# Patient Record
Sex: Female | Born: 1950 | Race: White | Hispanic: No | Marital: Married
Health system: Southern US, Academic
[De-identification: ages and names within clinical notes are randomized; demographics above are authoritative.]

## PROBLEM LIST (undated history)

## (undated) ENCOUNTER — Telehealth

## (undated) ENCOUNTER — Ambulatory Visit: Payer: MEDICARE

## (undated) ENCOUNTER — Encounter: Attending: Hematology & Oncology | Primary: Hematology & Oncology

## (undated) ENCOUNTER — Ambulatory Visit

## (undated) ENCOUNTER — Encounter

## (undated) ENCOUNTER — Encounter: Attending: Surgical Oncology | Primary: Surgical Oncology

## (undated) ENCOUNTER — Ambulatory Visit: Attending: Surgical Oncology | Primary: Surgical Oncology

## (undated) ENCOUNTER — Telehealth: Attending: Pharmacist | Primary: Pharmacist

## (undated) ENCOUNTER — Encounter: Attending: Adult Health | Primary: Adult Health

## (undated) ENCOUNTER — Telehealth: Attending: Hematology & Oncology | Primary: Hematology & Oncology

## (undated) ENCOUNTER — Telehealth: Attending: Nurse Practitioner | Primary: Nurse Practitioner

## (undated) ENCOUNTER — Ambulatory Visit: Attending: Radiation Oncology | Primary: Radiation Oncology

## (undated) ENCOUNTER — Telehealth: Attending: Family Medicine | Primary: Family Medicine

## (undated) ENCOUNTER — Encounter: Attending: Nurse Practitioner | Primary: Nurse Practitioner

## (undated) ENCOUNTER — Encounter: Attending: Pharmacist | Primary: Pharmacist

## (undated) ENCOUNTER — Telehealth: Attending: Internal Medicine | Primary: Internal Medicine

## (undated) ENCOUNTER — Ambulatory Visit: Payer: MEDICARE | Attending: Dermatology | Primary: Dermatology

## (undated) ENCOUNTER — Inpatient Hospital Stay

## (undated) ENCOUNTER — Ambulatory Visit: Payer: MEDICARE | Attending: Adult Health | Primary: Adult Health

## (undated) ENCOUNTER — Encounter: Attending: Internal Medicine | Primary: Internal Medicine

## (undated) ENCOUNTER — Ambulatory Visit: Payer: MEDICARE | Attending: Pharmacist | Primary: Pharmacist

## (undated) ENCOUNTER — Ambulatory Visit: Payer: MEDICARE | Attending: Radiation Oncology | Primary: Radiation Oncology

## (undated) ENCOUNTER — Institutional Professional Consult (permissible substitution): Payer: MEDICARE

## (undated) ENCOUNTER — Encounter: Attending: Registered" | Primary: Registered"

## (undated) ENCOUNTER — Telehealth: Attending: Adult Health | Primary: Adult Health

## (undated) ENCOUNTER — Ambulatory Visit: Payer: MEDICARE | Attending: Hematology & Oncology | Primary: Hematology & Oncology

## (undated) ENCOUNTER — Telehealth: Attending: Clinical | Primary: Clinical

## (undated) ENCOUNTER — Ambulatory Visit
Attending: Student in an Organized Health Care Education/Training Program | Primary: Student in an Organized Health Care Education/Training Program

## (undated) DIAGNOSIS — F319 Bipolar disorder, unspecified: Secondary | ICD-10-CM

## (undated) DIAGNOSIS — F17200 Nicotine dependence, unspecified, uncomplicated: Secondary | ICD-10-CM

## (undated) DIAGNOSIS — M1990 Unspecified osteoarthritis, unspecified site: Secondary | ICD-10-CM

## (undated) DIAGNOSIS — C50919 Malignant neoplasm of unspecified site of unspecified female breast: Secondary | ICD-10-CM

## (undated) HISTORY — PX: BREAST BIOPSY: SHX20

## (undated) HISTORY — PX: BREAST LUMPECTOMY: SHX2

## (undated) HISTORY — DX: Nicotine dependence, unspecified, uncomplicated: F17.200

## (undated) HISTORY — DX: Bipolar disorder, unspecified: F31.9

## (undated) HISTORY — DX: Unspecified osteoarthritis, unspecified site: M19.90

## (undated) HISTORY — DX: Malignant neoplasm of unspecified site of unspecified female breast: C50.919

---

## 2008-03-11 ENCOUNTER — Ambulatory Visit (HOSPITAL_COMMUNITY): Admission: RE | Admit: 2008-03-11 | Discharge: 2008-03-11 | Payer: Self-pay | Admitting: Internal Medicine

## 2011-08-27 ENCOUNTER — Other Ambulatory Visit (HOSPITAL_COMMUNITY): Payer: Self-pay | Admitting: Internal Medicine

## 2011-08-27 DIAGNOSIS — Z139 Encounter for screening, unspecified: Secondary | ICD-10-CM

## 2011-09-03 ENCOUNTER — Ambulatory Visit (HOSPITAL_COMMUNITY)
Admission: RE | Admit: 2011-09-03 | Discharge: 2011-09-03 | Disposition: A | Discharge status: Home / Self Care | Payer: BC Managed Care – PPO | Source: Ambulatory Visit | Attending: Internal Medicine | Admitting: Internal Medicine

## 2011-09-03 DIAGNOSIS — Z139 Encounter for screening, unspecified: Secondary | ICD-10-CM

## 2011-11-04 ENCOUNTER — Ambulatory Visit (HOSPITAL_COMMUNITY): Payer: BC Managed Care – PPO

## 2011-11-15 ENCOUNTER — Other Ambulatory Visit (HOSPITAL_COMMUNITY): Payer: Self-pay | Admitting: Internal Medicine

## 2011-11-15 DIAGNOSIS — R109 Unspecified abdominal pain: Secondary | ICD-10-CM

## 2011-11-21 ENCOUNTER — Ambulatory Visit (HOSPITAL_COMMUNITY)
Admission: RE | Admit: 2011-11-21 | Discharge: 2011-11-21 | Disposition: A | Discharge status: Home / Self Care | Payer: BC Managed Care – PPO | Source: Ambulatory Visit | Attending: Internal Medicine | Admitting: Internal Medicine

## 2011-11-21 DIAGNOSIS — K5730 Diverticulosis of large intestine without perforation or abscess without bleeding: Secondary | ICD-10-CM | POA: Insufficient documentation

## 2011-11-21 DIAGNOSIS — K7689 Other specified diseases of liver: Secondary | ICD-10-CM | POA: Insufficient documentation

## 2011-11-21 DIAGNOSIS — R109 Unspecified abdominal pain: Secondary | ICD-10-CM

## 2011-11-21 DIAGNOSIS — R1032 Left lower quadrant pain: Secondary | ICD-10-CM | POA: Insufficient documentation

## 2011-11-21 MED ORDER — IOHEXOL 300 MG/ML  SOLN
100.0000 mL | Freq: Once | INTRAMUSCULAR | Status: AC | PRN
  Administered 2011-11-21: 100 mL via INTRAVENOUS

## 2011-12-12 ENCOUNTER — Encounter (INDEPENDENT_AMBULATORY_CARE_PROVIDER_SITE_OTHER): Payer: Self-pay | Admitting: *Deleted

## 2011-12-19 ENCOUNTER — Ambulatory Visit (INDEPENDENT_AMBULATORY_CARE_PROVIDER_SITE_OTHER): Payer: BC Managed Care – PPO | Admitting: Internal Medicine

## 2011-12-24 ENCOUNTER — Encounter (INDEPENDENT_AMBULATORY_CARE_PROVIDER_SITE_OTHER): Payer: Self-pay | Admitting: Internal Medicine

## 2011-12-24 ENCOUNTER — Ambulatory Visit (INDEPENDENT_AMBULATORY_CARE_PROVIDER_SITE_OTHER): Payer: BC Managed Care – PPO | Admitting: Internal Medicine

## 2011-12-24 VITALS — BP 110/56 | HR 76 | Temp 98.0°F | Ht 70.0 in | Wt 153.0 lb

## 2011-12-24 DIAGNOSIS — R109 Unspecified abdominal pain: Secondary | ICD-10-CM

## 2011-12-24 LAB — CBC WITH DIFFERENTIAL/PLATELET
Eosinophils Absolute: 0.2 10*3/uL (ref 0.0–0.7)
Eosinophils Relative: 2 % (ref 0–5)
Hemoglobin: 14.1 g/dL (ref 12.0–15.0)
Lymphocytes Relative: 19 % (ref 12–46)
Lymphs Abs: 1.5 10*3/uL (ref 0.7–4.0)
MCH: 33.3 pg (ref 26.0–34.0)
MCV: 92.9 fL (ref 78.0–100.0)
Monocytes Relative: 6 % (ref 3–12)
Neutrophils Relative %: 73 % (ref 43–77)
RBC: 4.23 MIL/uL (ref 3.87–5.11)
WBC: 7.7 10*3/uL (ref 4.0–10.5)

## 2011-12-24 NOTE — Progress Notes (Signed)
Subjective:     Patient ID: Amy Ponce, female   DOB: Jun 09, 1951, 61 y.o.   MRN: [id number]  HPI Amy Ponce is a 61 yr old female referred to our office for abdominal pain.  IN May she had left flank pain and underwent a CT.  The pain comes and goes.  She tells me her acid reflux is controlled with the Prilosec. Appetite is good. Weight loss from taking care of her husband but she ha gained this back.  No abdominal pain.  She will have cramps sometimes if she has a BM. No melena or bright red rectal bleeding. BM x 1 a day   Her last colonoscopy was 5-6 yrs ago and was normal.                     11/2011: CT abdomen/pelvis with CM:    Clinical Data: Left flank pain, history of kidney infection   IMPRESSION:  1. No acute inflammatory process within abdomen or pelvis.  2. Again noted mild hepatic fatty infiltration.  3. No hydronephrosis or hydroureter. Bilateral renal symmetrical  excretion.  4. No pericecal inflammation.  5. Retroflexed uterus.   Review of Systems see hpi Current Outpatient Prescriptions  Medication Sig Dispense Refill  . ALPRAZolam (XANAX) 0.5 MG tablet Take 0.5 mg by mouth at bedtime as needed.      Marland Kitchen aspirin 81 MG tablet Take 81 mg by mouth daily.      Marland Kitchen lamoTRIgine (LAMICTAL) 100 MG tablet Take 100 mg by mouth daily.      Marland Kitchen omeprazole (PRILOSEC) 20 MG capsule Take 20 mg by mouth daily.      Marland Kitchen oxyCODONE-acetaminophen (PERCOCET) 5-325 MG per tablet Take 1 tablet by mouth every 4 (four) hours as needed.       Current Outpatient Prescriptions on File Prior to Visit  Medication Sig Dispense Refill  . lamoTRIgine (LAMICTAL) 100 MG tablet Take 100 mg by mouth daily.      Marland Kitchen omeprazole (PRILOSEC) 20 MG capsule Take 20 mg by mouth daily.       Past Medical History  Diagnosis Date  . Bipolar 1 disorder    Past Surgical History  Procedure Date  . Breast biopsy    History   Social History  . Marital Status: Married    Spouse Name: N/A    Number of Children: N/A   . Years of Education: N/A   Occupational History  . Not on file.   Social History Main Topics  . Smoking status: Current Everyday Smoker  . Smokeless tobacco: Not on file   Comment: 1 pack day  . Alcohol Use: No  . Drug Use: No  . Sexually Active: Not on file   Other Topics Concern  . Not on file   Social History Narrative  . No narrative on file   Family Status  Relation Status Death Age  . Mother Deceased     CAD  . Father Deceased     Diabetic complications  . Sister Deceased     One deceased CAD, Two in good health  . Brother Deceased     Two deceased from cancer, one died from ?    No Known Allergies      Objective:   Physical Exam Filed Vitals:   12/24/11 1146  Height: 5\' 10"  (1.778 m)  Weight: 153 lb (69.4 kg)  Alert and oriented. Skin warm and dry. Oral mucosa is moist.   . Sclera anicteric,  conjunctivae is pink. Thyroid not enlarged. No cervical lymphadenopathy. Lungs clear. Heart regular rate and rhythm.  Abdomen is soft. Bowel sounds are positive. No hepatomegaly. No abdominal masses felt. No tenderness.  No edema to lower extremities. Patient is alert and oriented.       Assessment:   Left flank pain which has resolved. I do not think this is a GI problem. If pain returns , however, she is to call our office.    Plan:    Please call office if pain returns.

## 2011-12-24 NOTE — Patient Instructions (Addendum)
Continue present medications. If pain returns, she will need to call our office. cmet and cbc today

## 2011-12-25 LAB — COMPREHENSIVE METABOLIC PANEL
ALT: 34 U/L (ref 0–35)
CO2: 29 mEq/L (ref 19–32)
Calcium: 9.6 mg/dL (ref 8.4–10.5)
Chloride: 103 mEq/L (ref 96–112)
Glucose, Bld: 106 mg/dL — ABNORMAL HIGH (ref 70–99)
Sodium: 139 mEq/L (ref 135–145)
Total Bilirubin: 0.4 mg/dL (ref 0.3–1.2)
Total Protein: 6.9 g/dL (ref 6.0–8.3)

## 2012-10-26 ENCOUNTER — Emergency Department (HOSPITAL_COMMUNITY)
Admission: EM | Admit: 2012-10-26 | Discharge: 2012-10-26 | Disposition: A | Discharge status: Home / Self Care | Payer: BC Managed Care – PPO | Attending: Emergency Medicine | Admitting: Emergency Medicine

## 2012-10-26 ENCOUNTER — Emergency Department (HOSPITAL_COMMUNITY): Payer: BC Managed Care – PPO

## 2012-10-26 ENCOUNTER — Encounter (HOSPITAL_COMMUNITY): Payer: Self-pay | Admitting: *Deleted

## 2012-10-26 ENCOUNTER — Encounter: Payer: Self-pay | Admitting: Emergency Medicine

## 2012-10-26 DIAGNOSIS — R079 Chest pain, unspecified: Secondary | ICD-10-CM

## 2012-10-26 DIAGNOSIS — F17200 Nicotine dependence, unspecified, uncomplicated: Secondary | ICD-10-CM | POA: Insufficient documentation

## 2012-10-26 DIAGNOSIS — Z79899 Other long term (current) drug therapy: Secondary | ICD-10-CM | POA: Insufficient documentation

## 2012-10-26 DIAGNOSIS — F319 Bipolar disorder, unspecified: Secondary | ICD-10-CM | POA: Insufficient documentation

## 2012-10-26 LAB — BASIC METABOLIC PANEL
CO2: 28 mEq/L (ref 19–32)
Calcium: 9.8 mg/dL (ref 8.4–10.5)
Chloride: 99 mEq/L (ref 96–112)
Glucose, Bld: 96 mg/dL (ref 70–99)
Potassium: 4.2 mEq/L (ref 3.5–5.1)
Sodium: 136 mEq/L (ref 135–145)

## 2012-10-26 LAB — CBC WITH DIFFERENTIAL/PLATELET
Basophils Absolute: 0 10*3/uL (ref 0.0–0.1)
Lymphocytes Relative: 28 % (ref 12–46)
Lymphs Abs: 2.2 10*3/uL (ref 0.7–4.0)
MCV: 96.5 fL (ref 78.0–100.0)
Neutro Abs: 5.1 10*3/uL (ref 1.7–7.7)
Neutrophils Relative %: 65 % (ref 43–77)
Platelets: 246 10*3/uL (ref 150–400)
RBC: 4.23 MIL/uL (ref 3.87–5.11)
RDW: 13.4 % (ref 11.5–15.5)
WBC: 7.9 10*3/uL (ref 4.0–10.5)

## 2012-10-26 LAB — TROPONIN I: Troponin I: <0.3 ng/mL (ref <0.30)

## 2012-10-26 MED ORDER — VERAPAMIL HCL 80 MG PO TABS: 80.0000 mg | ORAL_TABLET | Freq: Three times a day (TID) | ORAL | Status: DC

## 2012-10-26 MED ORDER — NITROGLYCERIN 0.4 MG SL SUBL: 0.4000 mg | SUBLINGUAL_TABLET | SUBLINGUAL | Status: AC | PRN

## 2012-10-26 NOTE — ED Notes (Signed)
Patient given water per RN approval. 

## 2012-10-26 NOTE — ED Provider Notes (Signed)
History  This chart was scribed for Nelia Shi, MD by Shari Heritage, ED Scribe. The patient was seen in room APA17/APA17. Patient's care was started at 1136.   CSN: [id number]  Arrival date & time 10/26/12  1127   First MD Initiated Contact with Patient 10/26/12 1136      Chief Complaint  Patient presents with  . Chest Pain     The history is provided by the patient. No language interpreter was used.     HPI Comments: Amy Ponce is a 62 y.o. female who presents to the Emergency Department complaining of intermittent, central chest pain onset 1 week ago. She states that pain worsened over the weekend. Pain is worse with exertion and is significantly improved with rest. There is associated shortness of breath, lightheadedness and nausea. She also states that she had an episode of diaphoresis yesterday. Patient says that she has a productive cough, but it is not different from baseline. Patient was sent to the ED for evaluation from Dr. Sharyon Medicus office where she was give 3 Bayer Aspirin. Patient had a stress test in 2012 which was negative. She states that she has a family history of MI (mother who died at 58).  Patient is a current every day smoker. She does not use alcohol. Her medical history includes bipolar 1 disorder.    Past Medical History  Diagnosis Date  . Bipolar 1 disorder     Past Surgical History  Procedure Laterality Date  . Breast biopsy      No family history on file.  History  Substance Use Topics  . Smoking status: Current Every Day Smoker    Types: Cigarettes  . Smokeless tobacco: Not on file     Comment: 1 pack day  . Alcohol Use: No    OB History   Grav Para Term Preterm Abortions TAB SAB Ect Mult Living                  Review of Systems A complete 10 system review of systems was obtained and all systems are negative except as noted in the HPI and PMH.   Allergies  Review of patient's allergies indicates no known allergies.  Home  Medications   Current Outpatient Rx  Name  Route  Sig  Dispense  Refill  . ALPRAZolam (XANAX) 0.5 MG tablet   Oral   Take 0.5 mg by mouth 4 (four) times daily as needed for anxiety.          Marland Kitchen aspirin 325 MG tablet   Oral   Take 975 mg by mouth daily as needed for pain.         . Cholecalciferol (VITAMIN D-3) 1000 UNITS CAPS   Oral   Take 1,000 Units by mouth daily.         Marland Kitchen lamoTRIgine (LAMICTAL) 100 MG tablet   Oral   Take 100 mg by mouth daily.         Marland Kitchen omeprazole (PRILOSEC) 20 MG capsule   Oral   Take 20 mg by mouth daily.         Marland Kitchen OVER THE COUNTER MEDICATION   Oral   Take 1 tablet by mouth daily. I-Cool for menopause.         Marland Kitchen oxyCODONE-acetaminophen (PERCOCET) 5-325 MG per tablet   Oral   Take 1 tablet by mouth every 4 (four) hours as needed for pain.          . nitroGLYCERIN (NITROSTAT)  0.4 MG SL tablet   Sublingual   Place 1 tablet (0.4 mg total) under the tongue every 5 (five) minutes as needed for chest pain.   30 tablet   0   . verapamil (CALAN) 80 MG tablet   Oral   Take 1 tablet (80 mg total) by mouth 3 (three) times daily.   30 tablet   0     Triage Vitals: BP 146/80  Temp(Src) 97.7 F (36.5 C) (Oral)  Resp 20  Ht 5\' 11"  (1.803 m)  Wt 156 lb (70.761 kg)  BMI 21.77 kg/m2  SpO2 98%  Physical Exam  Nursing note and vitals reviewed. Constitutional: She is oriented to person, place, and time. She appears well-developed and well-nourished. No distress.  HENT:  Head: Normocephalic and atraumatic.  Eyes: Pupils are equal, round, and reactive to light.  Neck: Normal range of motion.  Cardiovascular: Intact distal pulses.  Bradycardia present.   Pulmonary/Chest: No respiratory distress.  Abdominal: Normal appearance. She exhibits no distension.  Musculoskeletal: Normal range of motion.  Neurological: She is alert and oriented to person, place, and time. No cranial nerve deficit.  Skin: Skin is warm and dry. No rash noted.   Psychiatric: She has a normal mood and affect. Her behavior is normal.    ED Course  Procedures (including critical care time) DIAGNOSTIC STUDIES: Oxygen Saturation is 98% on room air, normal by my interpretation.    COORDINATION OF CARE: 11:58 AM- Patient informed of current plan for treatment and evaluation and agrees with plan at this time.    Date: 10/26/2012  Rate: 54  Rhythm: This bradycardia  QRS Axis: normal  Intervals: normal  ST/T Wave abnormalities: normal  Conduction Disutrbances: none  Narrative Interpretation: Otherwise.normal      Labs Reviewed  CBC WITH DIFFERENTIAL  BASIC METABOLIC PANEL  TROPONIN I    Dg Chest Portable 1 View  10/26/2012  *RADIOLOGY REPORT*  Clinical Data: Chest pain for couple weeks worsened for past 2 days, smoker  PORTABLE CHEST - 1 VIEW  Comparison: Portable exam 1144 hours without priors for comparison  Findings: Normal heart size, mediastinal contours, and pulmonary vascularity. Lungs hyperexpanded but clear. Minimal peribronchial thickening. No pleural effusion or pneumothorax. No acute osseous findings.  IMPRESSION: Hyperinflated lungs with minimal peribronchial thickening question COPD or asthma. No acute infiltrate.   Original Report Authenticated By: Ulyses Southward, M.D.      1. Chest pain on exertion       MDM  I personally performed the services described in this documentation, which was scribed in my presence. The recorded information has been reviewed and considered.  I discussed case with cardiology([facility]) who will arrange for outpatient followup this week  Nelia Shi, MD 10/27/12 [phone]

## 2012-10-26 NOTE — ED Notes (Signed)
Patient is resting comfortably. 

## 2012-10-26 NOTE — ED Notes (Signed)
Cp x 1 wk with sob, lightheadedness, and nausea.  Sent by Dr. Sherwood Gambler - states Dr. Sherwood Gambler gave 3 Bayer Aspirin while at his office.

## 2012-10-26 NOTE — ED Notes (Signed)
Pt states she chewed 3 325mg  coated ASA at office instructed by her doctor Fusco, CP x 1 week but worse over the weekend

## 2012-11-04 ENCOUNTER — Other Ambulatory Visit (HOSPITAL_COMMUNITY): Payer: Self-pay | Admitting: Cardiovascular Disease

## 2012-11-04 DIAGNOSIS — R079 Chest pain, unspecified: Secondary | ICD-10-CM

## 2012-11-04 DIAGNOSIS — R0602 Shortness of breath: Secondary | ICD-10-CM

## 2012-11-05 HISTORY — PX: TRANSTHORACIC ECHOCARDIOGRAM: SHX275

## 2012-11-05 HISTORY — PX: NM MYOCAR PERF WALL MOTION: HXRAD629

## 2012-11-09 ENCOUNTER — Ambulatory Visit (HOSPITAL_COMMUNITY)
Admission: RE | Admit: 2012-11-09 | Discharge: 2012-11-09 | Disposition: A | Discharge status: Home / Self Care | Payer: BC Managed Care – PPO | Source: Ambulatory Visit | Attending: Cardiovascular Disease | Admitting: Cardiovascular Disease

## 2012-11-09 DIAGNOSIS — R0602 Shortness of breath: Secondary | ICD-10-CM | POA: Insufficient documentation

## 2012-11-09 DIAGNOSIS — R079 Chest pain, unspecified: Secondary | ICD-10-CM

## 2012-11-09 DIAGNOSIS — R002 Palpitations: Secondary | ICD-10-CM | POA: Insufficient documentation

## 2012-11-09 DIAGNOSIS — R0609 Other forms of dyspnea: Secondary | ICD-10-CM | POA: Insufficient documentation

## 2012-11-09 DIAGNOSIS — R42 Dizziness and giddiness: Secondary | ICD-10-CM | POA: Insufficient documentation

## 2012-11-09 DIAGNOSIS — Z8249 Family history of ischemic heart disease and other diseases of the circulatory system: Secondary | ICD-10-CM | POA: Insufficient documentation

## 2012-11-09 DIAGNOSIS — F17200 Nicotine dependence, unspecified, uncomplicated: Secondary | ICD-10-CM | POA: Insufficient documentation

## 2012-11-09 DIAGNOSIS — R0989 Other specified symptoms and signs involving the circulatory and respiratory systems: Secondary | ICD-10-CM | POA: Insufficient documentation

## 2012-11-09 MED ORDER — REGADENOSON 0.4 MG/5ML IV SOLN
0.4000 mg | Freq: Once | INTRAVENOUS | Status: AC
  Administered 2012-11-09: 0.4 mg via INTRAVENOUS

## 2012-11-09 MED ORDER — TECHNETIUM TC 99M SESTAMIBI GENERIC - CARDIOLITE
10.9000 | Freq: Once | INTRAVENOUS | Status: AC | PRN
  Administered 2012-11-09: 10.9 via INTRAVENOUS

## 2012-11-09 MED ORDER — TECHNETIUM TC 99M SESTAMIBI GENERIC - CARDIOLITE
32.0000 | Freq: Once | INTRAVENOUS | Status: AC | PRN
  Administered 2012-11-09: 32 via INTRAVENOUS

## 2012-11-09 MED ORDER — AMINOPHYLLINE 25 MG/ML IV SOLN
75.0000 mg | Freq: Once | INTRAVENOUS | Status: AC
  Administered 2012-11-09: 75 mg via INTRAVENOUS

## 2012-11-09 NOTE — Progress Notes (Signed)
2D Echo Performed 11/09/2012    Cleda Imel, RCS  

## 2012-11-09 NOTE — Procedures (Addendum)
[facility] [address] [place] [address] [place] [address] [phone]  Cardiology Nuclear Med Study  Amy Ponce is a 62 y.o. female     MRN : [id number]     DOB: 02-26-1951  Procedure Date: 11/09/2012  Nuclear Med Background Indication for Stress Test:  Post Hospital History:  no prior cardiac or respiratory history reported by pt. Cardiac Risk Factors: Family History - CAD and Smoker  Symptoms:  Chest Pain, Dizziness, DOE, Fatigue, Light-Headedness, Palpitations and SOB   Nuclear Pre-Procedure Caffeine/Decaff Intake:  10:00pm NPO After: 8:00am   IV Site: R Antecubital  IV 0.9% NS with Angio Cath:  22g  Chest Size (in):  N/A IV Started by: Emmit Pomfret, RN  Height: 5\' 11"  (1.803 m)  Cup Size: B  BMI:  Body mass index is 22.19 kg/(m^2). Weight:  159 lb (72.122 kg)   Tech Comments:  B/A    Nuclear Med Study 1 or 2 day study: 1 day  Stress Test Type:  Lexiscan  Order Authorizing Provider:  Susa Griffins, MD   Resting Radionuclide: Technetium 72m Sestamibi  Resting Radionuclide Dose: 10.9 mCi   Stress Radionuclide:  Technetium 52m Sestamibi  Stress Radionuclide Dose: 32.0 mCi           Stress Protocol Rest HR: 55 Stress HR: 74  Rest BP: 129/88 Stress BP: 135/76  Exercise Time (min): n/a METS: n/a          Dose of Adenosine (mg):  n/a Dose of Lexiscan: 0.4 mg  Dose of Atropine (mg): n/a Dose of Dobutamine: n/a mcg/kg/min (at max HR)  Stress Test Technologist: Ernestene Mention, CCT Nuclear Technologist: Gonzella Lex, CNMT   Rest Procedure:  Myocardial perfusion imaging was performed at rest 45 minutes following the intravenous administration of Technetium 73m Sestamibi. Stress Procedure:  The patient received IV Lexiscan 0.4 mg over 15-seconds.  Technetium 59m Sestamibi injected at 30-seconds.  There were no significant changes with Lexiscan.  Quantitative spect images were obtained after a 45 minute  delay.  Transient Ischemic Dilatation (Normal <1.22):  1.12 Lung/Heart Ratio (Normal <0.45):  0.40 QGS EDV:  82 ml QGS ESV:  31 ml LV Ejection Fraction: 62%  Rest ECG: NSR - Normal EKG  Stress ECG: No significant change from baseline ECG  QPS Raw Data Images:  Normal; no motion artifact; normal heart/lung ratio. Stress Images:  Normal homogeneous uptake in all areas of the myocardium. Rest Images:  Normal homogeneous uptake in all areas of the myocardium. Subtraction (SDS):  No evidence of ischemia.  Impression Exercise Capacity:  Lexiscan with no exercise. BP Response:  Normal blood pressure response. Clinical Symptoms:  No significant symptoms noted. ECG Impression:  No significant ECG changes with Lexiscan. Comparison with Prior Nuclear Study: No previous nuclear study performed  Overall Impression:  Normal stress nuclear study.  LV Wall Motion:  NL LV Function; NL Wall Motion; EF 62%  Chrystie Nose, MD, [facility] Board Certified in Nuclear Cardiology Attending Cardiologist [facility]  Chrystie Nose, MD  11/09/2012 2:08 PM

## 2012-11-14 ENCOUNTER — Encounter: Payer: Self-pay | Admitting: Internal Medicine

## 2012-11-23 ENCOUNTER — Other Ambulatory Visit: Payer: Self-pay | Admitting: Cardiovascular Disease

## 2012-11-23 LAB — LIPID PANEL
HDL: 61 mg/dL (ref >39)
LDL Cholesterol: 111 mg/dL — ABNORMAL HIGH (ref 0–99)

## 2013-01-01 ENCOUNTER — Telehealth: Payer: Self-pay | Admitting: Cardiovascular Disease

## 2013-01-01 NOTE — Telephone Encounter (Signed)
Called patient and let her know that her metoprolol succ 25 mg was ok for 90 days 3 refills to [facility] pharmacy

## 2013-01-01 NOTE — Telephone Encounter (Signed)
Patient states that a refill for her Metoprolol Succ 25mg  was called in yesterday.  She needs a 90 day supply instead of 30 day so her insurance will cover it.  [facility] [place], [place]  [phone]

## 2013-02-05 ENCOUNTER — Telehealth: Payer: Self-pay | Admitting: Cardiovascular Disease

## 2013-02-05 MED ORDER — SIMVASTATIN 20 MG PO TABS: 20.0000 mg | ORAL_TABLET | Freq: Every day | ORAL | Status: DC

## 2013-02-05 NOTE — Telephone Encounter (Signed)
Patient needs refill for Simvastatin 20 mg # 90 called to [facility] in [place], [place]  [phone]

## 2013-02-05 NOTE — Telephone Encounter (Signed)
Returned call.  Pt stated her insurance won't pay for #30 and needs #90.  Informed Rx will be sent.  Pt verbalized understanding and agreed w/ plan.  Refill(s) sent to pharmacy.

## 2013-03-31 ENCOUNTER — Encounter: Payer: Self-pay | Admitting: Cardiovascular Disease

## 2013-07-26 ENCOUNTER — Encounter: Payer: Self-pay | Admitting: *Deleted

## 2013-07-30 ENCOUNTER — Ambulatory Visit: Payer: BC Managed Care – PPO | Admitting: Internal Medicine

## 2013-08-13 ENCOUNTER — Ambulatory Visit (INDEPENDENT_AMBULATORY_CARE_PROVIDER_SITE_OTHER): Payer: BC Managed Care – PPO | Admitting: Internal Medicine

## 2013-08-13 VITALS — BP 100/60 | HR 56 | Ht 71.0 in | Wt 161.0 lb

## 2013-08-13 DIAGNOSIS — K219 Gastro-esophageal reflux disease without esophagitis: Secondary | ICD-10-CM

## 2013-08-13 DIAGNOSIS — Z8249 Family history of ischemic heart disease and other diseases of the circulatory system: Secondary | ICD-10-CM

## 2013-08-13 DIAGNOSIS — F17200 Nicotine dependence, unspecified, uncomplicated: Secondary | ICD-10-CM

## 2013-08-13 DIAGNOSIS — Z720 Tobacco use: Secondary | ICD-10-CM

## 2013-08-13 DIAGNOSIS — R0789 Other chest pain: Secondary | ICD-10-CM

## 2013-08-13 DIAGNOSIS — I10 Essential (primary) hypertension: Secondary | ICD-10-CM

## 2013-08-13 NOTE — Patient Instructions (Signed)
Your physician recommends that you schedule a follow-up appointment in: One year.  

## 2013-08-16 ENCOUNTER — Encounter: Payer: Self-pay | Admitting: Internal Medicine

## 2013-08-16 DIAGNOSIS — I10 Essential (primary) hypertension: Secondary | ICD-10-CM | POA: Insufficient documentation

## 2013-08-16 DIAGNOSIS — Z8249 Family history of ischemic heart disease and other diseases of the circulatory system: Secondary | ICD-10-CM | POA: Insufficient documentation

## 2013-08-16 DIAGNOSIS — Z720 Tobacco use: Secondary | ICD-10-CM | POA: Insufficient documentation

## 2013-08-16 DIAGNOSIS — K219 Gastro-esophageal reflux disease without esophagitis: Secondary | ICD-10-CM | POA: Insufficient documentation

## 2013-08-16 NOTE — Progress Notes (Signed)
OFFICE NOTE  Chief Complaint:  Establish new cardiologist  Primary Care Physician: Glo Herring., MD  HPI:  Amy Ponce is a pleasant 63 year old female who was previously followed by Dr. Rollene Fare. She has a long-standing smoking history and strong family history of heart disease. Amy Ponce has bipolar disorder, osteoarthritis and a remote history of left-sided breast cancer status post lumpectomy and radiation therapy. She was initially evaluated for atypical type chest pain and had a Myoview in May of 2014 which was negative for ischemia. She does have a history of hypertension which is well controlled and very mild dyslipidemia and was previously on Zocor, but found that medication and tolerable to her and she's not taking it.  She denies any chest pain.  PMHx:  Past Medical History  Diagnosis Date  . Bipolar 1 disorder   . Smoker   . OA (osteoarthritis)   . Breast cancer     left-side; lumpectomy & radiation    Past Surgical History  Procedure Laterality Date  . Breast biopsy    . Breast lumpectomy Left   . Nm myocar perf wall motion  11/2012    lexiscan myoviewl normal LV function, nomral wall motion, EF 62%  . Transthoracic echocardiogram  11/2012    EF 50-55%; mild AV stenosis; mildly thickened anterior MV leaflets; mild AB stenosis; pericardial effusion     FAMHx:  Family History  Problem Relation Age of Onset  . Heart disease Mother   . Heart disease Sister   . Diabetes Father   . Cancer Brother     SOCHx:   reports that she has been smoking Cigarettes.  She has been smoking about 0.50 packs per day. She does not have any smokeless tobacco history on file. She reports that she does not drink alcohol or use illicit drugs.  ALLERGIES:  Allergies  Allergen Reactions  . Chantix [Varenicline] Other (See Comments)    Rash, mood swings    ROS: A comprehensive review of systems was negative.  HOME MEDS: Current Outpatient Prescriptions  Medication  Sig Dispense Refill  . ALPRAZolam (XANAX) 0.5 MG tablet Take 0.5 mg by mouth 4 (four) times daily as needed for anxiety.       Amy Ponce (REMIFEMIN PO) Take by mouth as needed.      . Cholecalciferol (VITAMIN D-3) 1000 UNITS CAPS Take 1,000 Units by mouth daily.      Marland Kitchen lamoTRIgine (LAMICTAL) 100 MG tablet Take 100 mg by mouth daily.      . metoprolol succinate (TOPROL-XL) 25 MG 24 hr tablet Take 25 mg by mouth daily.      . nitroGLYCERIN (NITROSTAT) 0.4 MG SL tablet Place 1 tablet (0.4 mg total) under the tongue every 5 (five) minutes as needed for chest pain.  30 tablet  0  . Omega-3 Fatty Acids (FISH OIL) 1000 MG CAPS Take by mouth.      Marland Kitchen omeprazole (PRILOSEC) 20 MG capsule Take 20 mg by mouth daily.      . ondansetron (ZOFRAN) 4 MG tablet Take 4 mg by mouth 2 (two) times daily as needed.       Marland Kitchen OVER THE COUNTER MEDICATION Take 1 tablet by mouth daily. I-Cool for menopause.      Marland Kitchen oxyCODONE-acetaminophen (PERCOCET) 5-325 MG per tablet Take 1 tablet by mouth every 4 (four) hours as needed for pain.       . verapamil (CALAN) 80 MG tablet Take 1 tablet (80 mg total) by mouth  3 (three) times daily.  30 tablet  0  . aspirin 81 MG tablet Take 81 mg by mouth daily.      . simvastatin (ZOCOR) 20 MG tablet Take 1 tablet (20 mg total) by mouth at bedtime.  90 tablet  3   No current facility-administered medications for this visit.    LABS/IMAGING: No results found for this or any previous visit (from the past 48 hour(s)). No results found.  VITALS: BP 100/60  Pulse 56  Ht 5\' 11"  (1.803 m)  Wt 161 lb (73.029 kg)  BMI 22.46 kg/m2  EXAM: General appearance: alert and no distress Neck: no carotid bruit and no JVD Lungs: clear to auscultation bilaterally Heart: regular rate and rhythm, S1, S2 normal, no murmur, click, rub or gallop Abdomen: soft, non-tender; bowel sounds normal; no masses,  no organomegaly Extremities: extremities normal, atraumatic, no cyanosis or edema Pulses: 2+ and  symmetric Skin: Skin color, texture, turgor normal. No rashes or lesions Neurologic: Grossly normal Psych: Mood, affect normal  EKG: Sinus bradycardia at 56  ASSESSMENT: 1. Hypertension 2. Dyslipidemia-statin intolerant 3. Family history of coronary disease 4. GERD 5. Atypical chest pain  PLAN: 1.   Unfortunately Amy Ponce continues to smoke and has ongoing risk factors for coronary disease. We again discussed smoking cessation. Unfortunately she was intolerant of simvastatin. I would like to recheck her lipid profile and most likely recommend an alternative statin medication due to her strong family history of heart disease. Also recommend continuing her current medications for hypertension.  Pixie Casino, MD, [facility] Attending Cardiologist [facility]  HILTY,Kenneth C 08/16/2013, 8:21 AM

## 2013-12-27 ENCOUNTER — Ambulatory Visit (INDEPENDENT_AMBULATORY_CARE_PROVIDER_SITE_OTHER): Payer: BC Managed Care – PPO | Admitting: Internal Medicine

## 2013-12-29 ENCOUNTER — Telehealth: Payer: Self-pay | Admitting: Internal Medicine

## 2013-12-29 MED ORDER — METOPROLOL SUCCINATE ER 25 MG PO TB24: 25.0000 mg | ORAL_TABLET | Freq: Every day | ORAL | Status: DC

## 2013-12-29 NOTE — Telephone Encounter (Signed)
Need a new prescription for her Metoprolol 25 mg.Please call to [phone]

## 2013-12-29 NOTE — Telephone Encounter (Signed)
Rx was sent to pharmacy electronically. 

## 2014-02-05 ENCOUNTER — Emergency Department (HOSPITAL_COMMUNITY): Payer: BC Managed Care – PPO

## 2014-02-05 ENCOUNTER — Encounter (HOSPITAL_COMMUNITY): Payer: Self-pay | Admitting: Emergency Medicine

## 2014-02-05 ENCOUNTER — Emergency Department (HOSPITAL_COMMUNITY)
Admission: EM | Admit: 2014-02-05 | Discharge: 2014-02-06 | Disposition: A | Discharge status: Home / Self Care | Payer: BC Managed Care – PPO | Attending: Emergency Medicine | Admitting: Emergency Medicine

## 2014-02-05 DIAGNOSIS — Z853 Personal history of malignant neoplasm of breast: Secondary | ICD-10-CM | POA: Insufficient documentation

## 2014-02-05 DIAGNOSIS — F17200 Nicotine dependence, unspecified, uncomplicated: Secondary | ICD-10-CM | POA: Insufficient documentation

## 2014-02-05 DIAGNOSIS — F319 Bipolar disorder, unspecified: Secondary | ICD-10-CM | POA: Insufficient documentation

## 2014-02-05 DIAGNOSIS — Z79899 Other long term (current) drug therapy: Secondary | ICD-10-CM | POA: Insufficient documentation

## 2014-02-05 DIAGNOSIS — R079 Chest pain, unspecified: Secondary | ICD-10-CM | POA: Insufficient documentation

## 2014-02-05 DIAGNOSIS — J3489 Other specified disorders of nose and nasal sinuses: Secondary | ICD-10-CM | POA: Insufficient documentation

## 2014-02-05 DIAGNOSIS — R0789 Other chest pain: Secondary | ICD-10-CM

## 2014-02-05 DIAGNOSIS — M129 Arthropathy, unspecified: Secondary | ICD-10-CM | POA: Insufficient documentation

## 2014-02-05 DIAGNOSIS — R071 Chest pain on breathing: Secondary | ICD-10-CM | POA: Insufficient documentation

## 2014-02-05 DIAGNOSIS — Z9889 Other specified postprocedural states: Secondary | ICD-10-CM | POA: Insufficient documentation

## 2014-02-05 DIAGNOSIS — M25519 Pain in unspecified shoulder: Secondary | ICD-10-CM | POA: Insufficient documentation

## 2014-02-05 LAB — BASIC METABOLIC PANEL
ANION GAP: 11 (ref 5–15)
BUN: 10 mg/dL (ref 6–23)
CALCIUM: 9.7 mg/dL (ref 8.4–10.5)
CO2: 27 mEq/L (ref 19–32)
CREATININE: 0.71 mg/dL (ref 0.50–1.10)
Chloride: 101 mEq/L (ref 96–112)
GFR calc Af Amer: >90 mL/min (ref >90)
GFR, EST NON AFRICAN AMERICAN: 90 mL/min — AB (ref >90)
Glucose, Bld: 93 mg/dL (ref 70–99)
Potassium: 4.1 mEq/L (ref 3.7–5.3)
SODIUM: 139 meq/L (ref 137–147)

## 2014-02-05 LAB — CBC WITH DIFFERENTIAL/PLATELET
BASOS ABS: 0.1 10*3/uL (ref 0.0–0.1)
Basophils Relative: 1 % (ref 0–1)
EOS PCT: 3 % (ref 0–5)
Eosinophils Absolute: 0.2 10*3/uL (ref 0.0–0.7)
HCT: 40.1 % (ref 36.0–46.0)
Hemoglobin: 13.6 g/dL (ref 12.0–15.0)
LYMPHS PCT: 45 % (ref 12–46)
Lymphs Abs: 2.8 10*3/uL (ref 0.7–4.0)
MCH: 32.7 pg (ref 26.0–34.0)
MCHC: 33.9 g/dL (ref 30.0–36.0)
MCV: 96.4 fL (ref 78.0–100.0)
Monocytes Absolute: 0.5 10*3/uL (ref 0.1–1.0)
Monocytes Relative: 8 % (ref 3–12)
Neutro Abs: 2.7 10*3/uL (ref 1.7–7.7)
Neutrophils Relative %: 43 % (ref 43–77)
PLATELETS: 243 10*3/uL (ref 150–400)
RBC: 4.16 MIL/uL (ref 3.87–5.11)
RDW: 13.3 % (ref 11.5–15.5)
WBC: 6.2 10*3/uL (ref 4.0–10.5)

## 2014-02-05 LAB — TROPONIN I

## 2014-02-05 MED ORDER — OXYCODONE-ACETAMINOPHEN 5-325 MG PO TABS
2.0000 | ORAL_TABLET | Freq: Once | ORAL | Status: AC
Start: 2014-02-05 — End: 2014-02-05
  Administered 2014-02-05: 2 via ORAL
  Filled 2014-02-05: qty 2

## 2014-02-05 NOTE — ED Notes (Signed)
Right side cp radiating to back with intermittent sob x 1 week.

## 2014-02-05 NOTE — Discharge Instructions (Signed)

## 2014-02-05 NOTE — ED Provider Notes (Signed)
CSN: [id number]     Arrival date & time 02/05/14  1802 History  This chart was scribed for Shaune Pollack, MD by Girtha Hake, ED Scribe. The patient was seen in [place]. The patient's care was started at 9:15 PM.   Chief Complaint  Patient presents with  . Chest Pain   Patient is a 63 y.o. female presenting with chest pain. The history is provided by the patient. No language interpreter was used.  Chest Pain Associated symptoms: cough   Associated symptoms: no nausea and not vomiting    HPI Comments: Amy Ponce is a 63 y.o. female who presents to the Emergency Department complaining of pain under her right shoulder blade that radiates to her chest. Patient reports that this pain began 5 days ago. She states that the pain is exacerbated by ROM of the shoulder. She believes the pain could be associated with lifting heavy luggage during a beach trip last week. Patient denies nausea, vomiting, leg pain, leg swelling, trouble eating or drinking.   Patient also complains of a productive cough that began 6 days ago when she returned from the beach. She reports that the cough produces yellow sputum. Patient reports associated rhinorrhea and congestion.   PCP is Dr. Gerarda Fraction.  Past Medical History  Diagnosis Date  . Bipolar 1 disorder   . Smoker   . OA (osteoarthritis)   . Breast cancer     left-side; lumpectomy & radiation   Past Surgical History  Procedure Laterality Date  . Breast biopsy    . Breast lumpectomy Left   . Nm myocar perf wall motion  11/2012    lexiscan myoviewl normal LV function, nomral wall motion, EF 62%  . Transthoracic echocardiogram  11/2012    EF 50-55%; mild AV stenosis; mildly thickened anterior MV leaflets; mild AB stenosis; pericardial effusion    Family History  Problem Relation Age of Onset  . Heart disease Mother   . Heart disease Sister   . Diabetes Father   . Cancer Brother    History  Substance Use Topics  . Smoking status: Current Every  Day Smoker -- 0.50 packs/day    Types: Cigarettes  . Smokeless tobacco: Not on file     Comment: 1 pack day  . Alcohol Use: No   OB History   Grav Para Term Preterm Abortions TAB SAB Ect Mult Living                 Review of Systems  Constitutional: Negative for appetite change.  HENT: Positive for congestion and rhinorrhea.   Respiratory: Positive for cough.   Cardiovascular: Positive for chest pain. Negative for leg swelling.  Gastrointestinal: Negative for nausea and vomiting.  Musculoskeletal: Positive for arthralgias (right shoulder).  All other systems reviewed and are negative.     Allergies  Chantix  Home Medications   Prior to Admission medications   Medication Sig Start Date End Date Taking? Authorizing Provider  ALPRAZolam Duanne Moron) 0.5 MG tablet Take 0.5 mg by mouth 4 (four) times daily as needed for anxiety.    Yes Historical Provider, MD  Cholecalciferol (VITAMIN D-3) 1000 UNITS CAPS Take 1,000 Units by mouth daily.   Yes Historical Provider, MD  lamoTRIgine (LAMICTAL) 100 MG tablet Take 100 mg by mouth daily.   Yes Historical Provider, MD  metoprolol succinate (TOPROL-XL) 25 MG 24 hr tablet Take 1 tablet (25 mg total) by mouth daily. 12/29/13  Yes Pixie Casino, MD  Nutritional Supplements (  ESTROVEN PO) Take 1 tablet by mouth daily.   Yes Historical Provider, MD  oxyCODONE-acetaminophen (PERCOCET) 5-325 MG per tablet Take 1 tablet by mouth every 6 (six) hours as needed. pain   Yes Historical Provider, MD  pantoprazole (PROTONIX) 40 MG tablet Take 40 mg by mouth daily.   Yes Historical Provider, MD  nitroGLYCERIN (NITROSTAT) 0.4 MG SL tablet Place 1 tablet (0.4 mg total) under the tongue every 5 (five) minutes as needed for chest pain. 10/26/12   Dot Lanes, MD   Triage Vitals: BP 118/62  Pulse 47  Temp(Src) 98.1 F (36.7 C) (Oral)  Resp 13  Ht 5\' 11"  (1.803 m)  Wt 163 lb (73.936 kg)  BMI 22.74 kg/m2  SpO2 97% Physical Exam  Nursing note and vitals  reviewed. Constitutional: She is oriented to person, place, and time. She appears well-developed and well-nourished. No distress.  HENT:  Head: Normocephalic and atraumatic.  Right Ear: External ear normal.  Left Ear: External ear normal.  Nose: Nose normal.  Mouth/Throat: Oropharynx is clear and moist.  Eyes: Conjunctivae and EOM are normal. Pupils are equal, round, and reactive to light.  Neck: Normal range of motion. Neck supple.  Cardiovascular: Normal rate, regular rhythm, normal heart sounds and intact distal pulses.   Pulmonary/Chest: Effort normal and breath sounds normal.  Abdominal: Soft. Bowel sounds are normal.  Musculoskeletal: Normal range of motion.  Neurological: She is alert and oriented to person, place, and time. She has normal reflexes.  Skin: Skin is warm and dry.  Psychiatric: She has a normal mood and affect. Her behavior is normal. Thought content normal.    ED Course  Procedures (including critical care time) DIAGNOSTIC STUDIES: Oxygen Saturation is 97% on room air,normal by my interpretation.    COORDINATION OF CARE: 9:21 PM-Discussed treatment plan which includes EKG, CXR, and labs with pt at bedside and pt agreed to plan.     Labs Review Labs Reviewed  CBC WITH DIFFERENTIAL  BASIC METABOLIC PANEL  TROPONIN I    Imaging Review Dg Chest 2 View  02/05/2014   CLINICAL DATA:  Right-sided chest pain  EXAM: CHEST  2 VIEW  COMPARISON:  10/26/2012  FINDINGS: Cardiac shadow is stable. The lungs are well aerated bilaterally without focal infiltrate or sizable effusion. No acute bony abnormality is seen.  IMPRESSION: No acute abnormality noted.   Electronically Signed   By: Inez Catalina M.D.   On: 02/05/2014 19:05     EKG Interpretation   Date/Time:  Saturday February 05 2014 18:05:17 EDT Ventricular Rate:  52 PR Interval:  188 QRS Duration: 90 QT Interval:  428 QTC Calculation: 398 R Axis:   48 Text Interpretation:  Sinus bradycardia Nonspecific T wave  abnormality  Abnormal ECG No significant change since last tracing Confirmed by Reeder Brisby MD,  Andee Poles [phone]) on 02/05/2014 10:44:49 PM      MDM   Final diagnoses:  Chest wall pain   I personally performed the services described in this documentation, which was scribed in my presence. The recorded information has been reviewed and considered.    Shaune Pollack, MD 02/07/14 1240

## 2014-02-06 NOTE — ED Notes (Signed)
Discharge instructions given by Les Pou, RN.  Patient ambulatory out of department.

## 2014-02-14 ENCOUNTER — Telehealth: Payer: Self-pay | Admitting: Internal Medicine

## 2014-02-14 MED ORDER — PANTOPRAZOLE SODIUM 40 MG PO TBEC: 40.0000 mg | DELAYED_RELEASE_TABLET | Freq: Every day | ORAL | Status: DC

## 2014-02-14 NOTE — Telephone Encounter (Signed)
Patient needs refill for generic Protonix 40 mg 1 daily  # 90 with 3 refills called or faxed to [facility].  Phone # [phone].   Please let patient know when this has been done.

## 2014-02-14 NOTE — Telephone Encounter (Signed)
Rx was sent to pharmacy electronically. Patient notified.  

## 2014-03-02 ENCOUNTER — Other Ambulatory Visit: Payer: Self-pay | Admitting: *Deleted

## 2014-03-02 MED ORDER — METOPROLOL SUCCINATE ER 25 MG PO TB24: 25.0000 mg | ORAL_TABLET | Freq: Every day | ORAL | Status: DC

## 2014-03-04 ENCOUNTER — Telehealth: Payer: Self-pay | Admitting: Internal Medicine

## 2014-03-04 MED ORDER — METOPROLOL SUCCINATE ER 25 MG PO TB24: 25.0000 mg | ORAL_TABLET | Freq: Every day | ORAL | Status: DC

## 2014-03-04 NOTE — Telephone Encounter (Signed)
Pt called in stating that she would like [facility] [place] on [address]. [phone]). Please call  Thanks

## 2014-03-04 NOTE — Telephone Encounter (Signed)
Rx was sent to pharmacy electronically. 

## 2014-06-25 ENCOUNTER — Other Ambulatory Visit: Payer: Self-pay | Admitting: Internal Medicine

## 2014-06-27 NOTE — Telephone Encounter (Signed)
Rx(s) sent to pharmacy electronically.  

## 2014-07-12 IMAGING — CR DG CHEST 1V PORT
1 series · 1 of 1 positions shown · non-contrast
Comparison: Portable exam 8833 hours without priors for comparison

CLINICAL DATA: Chest pain for couple weeks worsened for past 2
days, smoker

PORTABLE CHEST - 1 VIEW

[view not recorded]
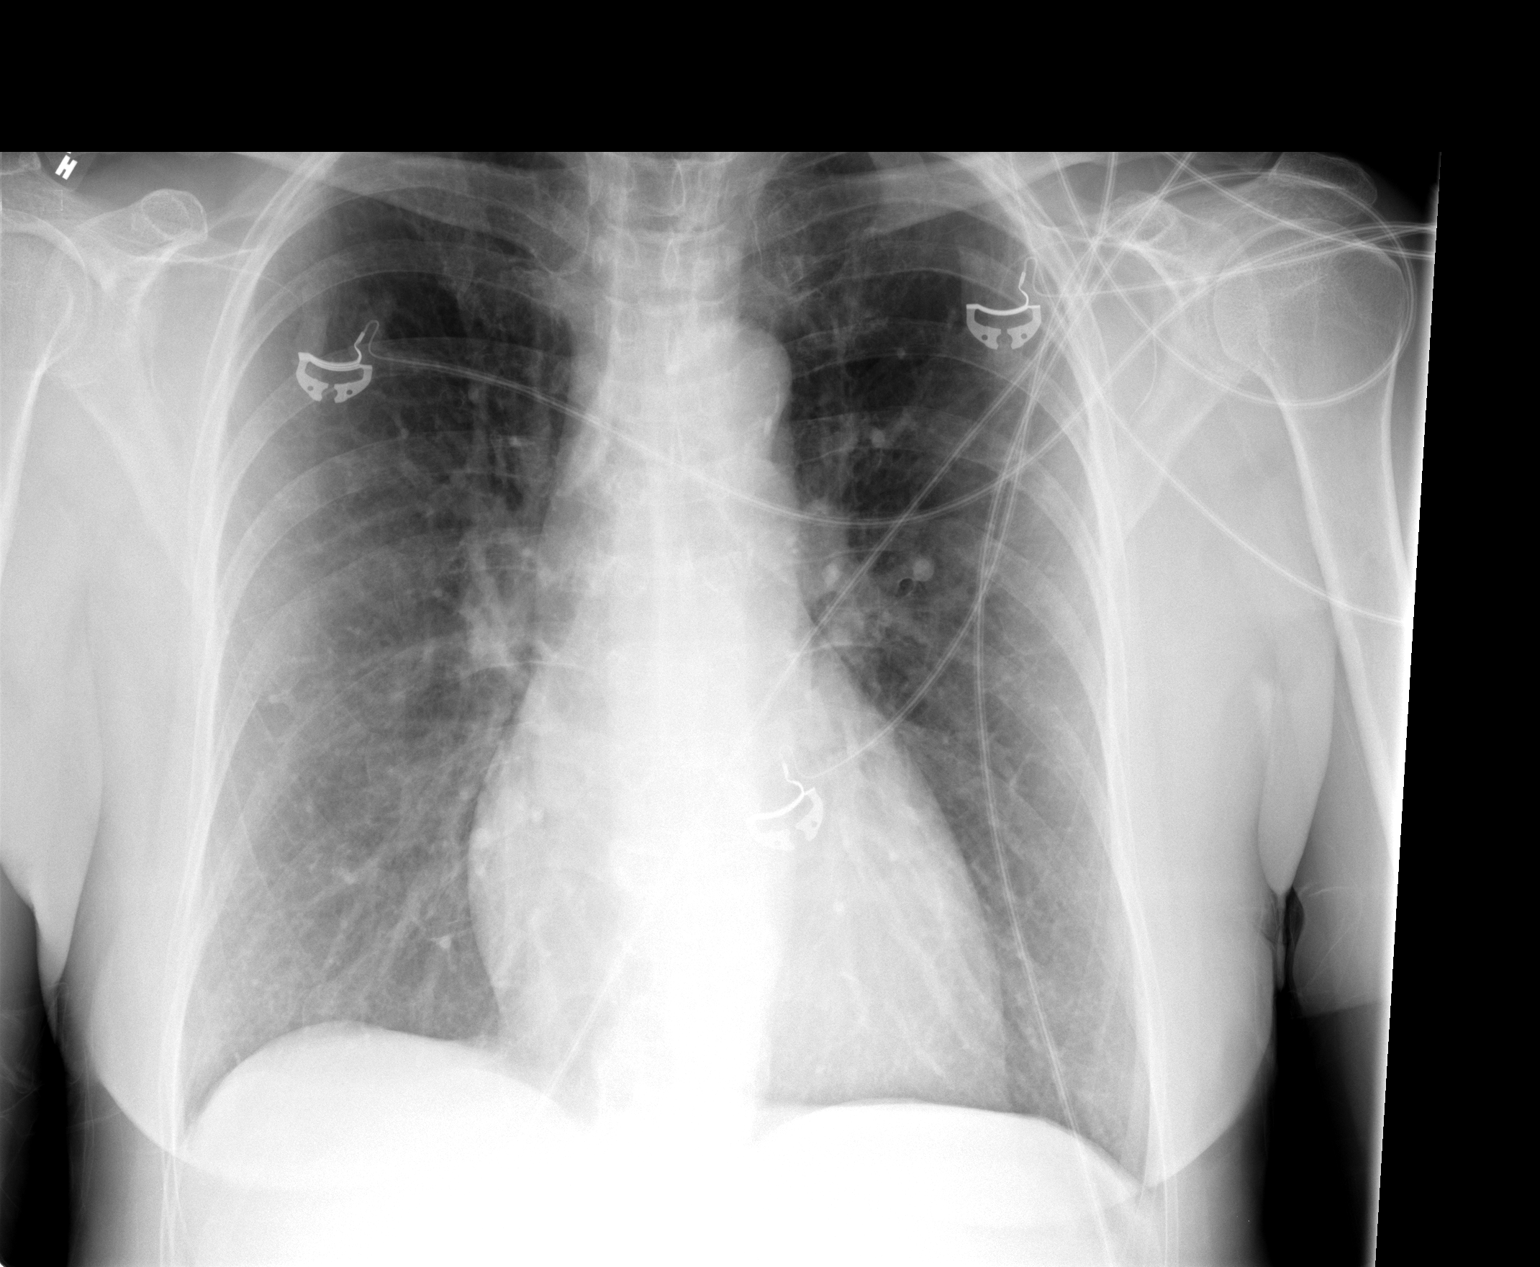

[1 of 1 positions shown; findings below may reference images not displayed]

FINDINGS: Normal heart size, mediastinal contours, and pulmonary vascularity.
Lungs hyperexpanded but clear.
Minimal peribronchial thickening.
No pleural effusion or pneumothorax.
No acute osseous findings.
IMPRESSION: Hyperinflated lungs with minimal peribronchial thickening question
COPD or asthma.
No acute infiltrate.

## 2014-10-29 ENCOUNTER — Other Ambulatory Visit: Payer: Self-pay | Admitting: Internal Medicine

## 2014-11-02 NOTE — Telephone Encounter (Signed)
Rx(s) sent to pharmacy electronically. Staff message sent to schedulers to contact patient for an OV

## 2014-11-04 ENCOUNTER — Other Ambulatory Visit: Payer: Self-pay | Admitting: Internal Medicine

## 2014-11-04 MED ORDER — PANTOPRAZOLE SODIUM 40 MG PO TBEC: 40.0000 mg | DELAYED_RELEASE_TABLET | Freq: Every day | ORAL | Status: DC

## 2014-11-04 NOTE — Telephone Encounter (Signed)
°  1. Which medications need to be refilled? Pantoprazole  2. Which pharmacy is medication to be sent to?[facility]  3. Do they need a 30 day or 90 day supply? 90 and refills--pt has appointment in June  4. Would they like a call back once the medication has been sent to the pharmacy? yes

## 2014-11-04 NOTE — Telephone Encounter (Signed)
Spoke with pt, aware script has been sent in electronically.

## 2014-12-20 ENCOUNTER — Encounter: Payer: Self-pay | Admitting: Internal Medicine

## 2014-12-20 ENCOUNTER — Ambulatory Visit (INDEPENDENT_AMBULATORY_CARE_PROVIDER_SITE_OTHER): Payer: BLUE CROSS/BLUE SHIELD | Admitting: Internal Medicine

## 2014-12-20 VITALS — BP 128/72 | HR 67 | Ht 71.0 in | Wt 164.4 lb

## 2014-12-20 DIAGNOSIS — Z720 Tobacco use: Secondary | ICD-10-CM | POA: Diagnosis not present

## 2014-12-20 DIAGNOSIS — Z8249 Family history of ischemic heart disease and other diseases of the circulatory system: Secondary | ICD-10-CM

## 2014-12-20 DIAGNOSIS — I10 Essential (primary) hypertension: Secondary | ICD-10-CM | POA: Diagnosis not present

## 2014-12-20 DIAGNOSIS — E785 Hyperlipidemia, unspecified: Secondary | ICD-10-CM | POA: Diagnosis not present

## 2014-12-20 NOTE — Progress Notes (Signed)
OFFICE NOTE  Chief Complaint:  No complaints  Primary Care Physician: Amy Herring., MD  HPI:  Amy Ponce is a pleasant 64 year old female who was previously followed by Amy Ponce. She has a long-standing smoking history and strong family history of heart disease. Amy Ponce has bipolar disorder, osteoarthritis and a remote history of left-sided breast cancer status post lumpectomy and radiation therapy. She was initially evaluated for atypical type chest pain and had a Myoview in May of 2014 which was negative for ischemia. She does have a history of hypertension which is well controlled and very mild dyslipidemia and was previously on Zocor, but found that medication and tolerable to her and she's not taking it.  She denies any chest pain.  I saw Amy Ponce back in the office today. Overall she is doing well with no complaints. Unfortunately she's not been able to stop smoking. This is despite the history of breast cancer and the fact that she knows that she is at high risk for coronary artery disease based on her family history. She is on simvastatin and followed by her primary care for this. She also takes metoprolol and overall feels well.  PMHx:  Past Medical History  Diagnosis Date  . Bipolar 1 disorder   . Smoker   . OA (osteoarthritis)   . Breast cancer     left-side; lumpectomy & radiation    Past Surgical History  Procedure Laterality Date  . Breast biopsy    . Breast lumpectomy Left   . Nm myocar perf wall motion  11/2012    lexiscan myoviewl normal LV function, nomral wall motion, EF 62%  . Transthoracic echocardiogram  11/2012    EF 50-55%; mild AV stenosis; mildly thickened anterior MV leaflets; mild AB stenosis; pericardial effusion     FAMHx:  Family History  Problem Relation Age of Onset  . Heart disease Mother   . Heart disease Sister   . Diabetes Father   . Cancer Brother     SOCHx:   reports that she has been smoking Cigarettes.  She has  been smoking about 0.50 packs per day. She does not have any smokeless tobacco history on file. She reports that she does not drink alcohol or use illicit drugs.  ALLERGIES:  Allergies  Allergen Reactions  . Chantix [Varenicline] Other (See Comments)    Rash, mood swings    ROS: A comprehensive review of systems was negative.  HOME MEDS: Current Outpatient Prescriptions  Medication Sig Dispense Refill  . ALPRAZolam (XANAX) 0.5 MG tablet Take 0.5 mg by mouth 4 (four) times daily as needed for anxiety.     . Biotin (BIOTIN MAXIMUM STRENGTH) 10 MG TABS Take 1 tablet by mouth daily.    . Cholecalciferol (VITAMIN D-3) 1000 UNITS CAPS Take 1,000 Units by mouth daily.    Marland Kitchen lamoTRIgine (LAMICTAL) 100 MG tablet Take 100 mg by mouth daily.    . metoprolol succinate (TOPROL-XL) 25 MG 24 hr tablet Take 1 tablet (25 mg total) by mouth daily. <PLEASE MAKE APPOINTMENT FOR REFILLS> 90 tablet 0  . nitroGLYCERIN (NITROSTAT) 0.4 MG SL tablet Place 1 tablet (0.4 mg total) under the tongue every 5 (five) minutes as needed for chest pain. 30 tablet 0  . Nutritional Supplements (ESTROVEN WEIGHT MANAGEMENT) CAPS Take 1 capsule by mouth daily.    . Omega-3 Fatty Acids (FISH OIL) 1200 MG CAPS Take 1 capsule by mouth.    . oxyCODONE-acetaminophen (PERCOCET) 5-325 MG per tablet Take  1 tablet by mouth every 6 (six) hours as needed. pain    . pantoprazole (PROTONIX) 40 MG tablet Take 1 tablet (40 mg total) by mouth daily. 90 tablet 2  . simvastatin (ZOCOR) 40 MG tablet Take 40 mg by mouth daily. Take 1 tab daily  1   No current facility-administered medications for this visit.    LABS/IMAGING: No results found for this or any previous visit (from the past 48 hour(s)). No results found.  VITALS: BP 128/72 mmHg  Pulse 67  Ht 5\' 11"  (1.803 m)  Wt 164 lb 6.4 oz (74.571 kg)  BMI 22.94 kg/m2  EXAM: General appearance: alert and no distress Neck: no carotid bruit and no JVD Lungs: clear to auscultation  bilaterally Heart: regular rate and rhythm, S1, S2 normal, no murmur, click, rub or gallop Abdomen: soft, non-tender; bowel sounds normal; no masses,  no organomegaly Extremities: extremities normal, atraumatic, no cyanosis or edema Pulses: 2+ and symmetric Skin: Skin color, texture, turgor normal. No rashes or lesions Neurologic: Grossly normal Psych: Mood, affect normal  EKG: Normal sinus rhythm at 67  ASSESSMENT: 1. Hypertension 2. Dyslipidemia 3. Family history of coronary disease 4. GERD 5. Atypical chest pain  PLAN: 1.   [name] at fortunately continues to smoke. She says she has a pack of nicotine patches and wishes to quit sometime in the near future. With a discussion about this and I encouraged her strongly to give it a try. Blood pressures well controlled. She is on simvastatin for cholesterol control. Overall she's not currently having any cardiac symptoms. Plan to see her back annually.  Amy Casino, MD, [facility] Attending Cardiologist [place] 12/20/2014, 5:28 PM

## 2014-12-20 NOTE — Patient Instructions (Signed)
Your physician wants you to follow-up in: 1 Year. You will receive a reminder letter in the mail two months in advance. If you don't receive a letter, please call our office to schedule the follow-up appointment.  

## 2015-01-25 ENCOUNTER — Other Ambulatory Visit: Payer: Self-pay | Admitting: *Deleted

## 2015-01-25 MED ORDER — PANTOPRAZOLE SODIUM 40 MG PO TBEC: 40.0000 mg | DELAYED_RELEASE_TABLET | Freq: Every day | ORAL | Status: AC

## 2015-02-20 ENCOUNTER — Telehealth: Payer: Self-pay | Admitting: Internal Medicine

## 2015-02-20 MED ORDER — METOPROLOL SUCCINATE ER 25 MG PO TB24: 25.0000 mg | ORAL_TABLET | Freq: Every day | ORAL | Status: AC

## 2015-02-20 NOTE — Telephone Encounter (Signed)
Please fax refill for Metoprolol Succ 25 mg--# 90 [facility] [address] In [place], [place] .  Patient has enough medicine through Thursday of this week---let her know when this is complete.

## 2015-02-20 NOTE — Telephone Encounter (Signed)
Refill submitted to patient's preferred pharmacy. Informed patient. Pt voiced understanding, no other stated concerns at this time.  

## 2015-04-24 DIAGNOSIS — G894 Chronic pain syndrome: Secondary | ICD-10-CM | POA: Diagnosis not present

## 2015-04-24 DIAGNOSIS — Z1389 Encounter for screening for other disorder: Secondary | ICD-10-CM | POA: Diagnosis not present

## 2015-04-24 DIAGNOSIS — F419 Anxiety disorder, unspecified: Secondary | ICD-10-CM | POA: Diagnosis not present

## 2015-04-24 DIAGNOSIS — Z23 Encounter for immunization: Secondary | ICD-10-CM | POA: Diagnosis not present

## 2015-04-24 DIAGNOSIS — Z6822 Body mass index (BMI) 22.0-22.9, adult: Secondary | ICD-10-CM | POA: Diagnosis not present

## 2015-04-24 DIAGNOSIS — E782 Mixed hyperlipidemia: Secondary | ICD-10-CM | POA: Diagnosis not present

## 2015-06-07 DIAGNOSIS — R1011 Right upper quadrant pain: Secondary | ICD-10-CM | POA: Diagnosis not present

## 2015-06-09 DIAGNOSIS — R1011 Right upper quadrant pain: Secondary | ICD-10-CM | POA: Diagnosis not present

## 2015-07-27 DIAGNOSIS — Z1389 Encounter for screening for other disorder: Secondary | ICD-10-CM | POA: Diagnosis not present

## 2015-07-27 DIAGNOSIS — F419 Anxiety disorder, unspecified: Secondary | ICD-10-CM | POA: Diagnosis not present

## 2015-07-27 DIAGNOSIS — G894 Chronic pain syndrome: Secondary | ICD-10-CM | POA: Diagnosis not present

## 2015-07-27 DIAGNOSIS — Z6822 Body mass index (BMI) 22.0-22.9, adult: Secondary | ICD-10-CM | POA: Diagnosis not present

## 2015-10-26 DIAGNOSIS — N951 Menopausal and female climacteric states: Secondary | ICD-10-CM | POA: Diagnosis not present

## 2015-10-26 DIAGNOSIS — Z01419 Encounter for gynecological examination (general) (routine) without abnormal findings: Secondary | ICD-10-CM | POA: Diagnosis not present

## 2015-10-26 DIAGNOSIS — Z1231 Encounter for screening mammogram for malignant neoplasm of breast: Secondary | ICD-10-CM | POA: Diagnosis not present

## 2015-10-30 DIAGNOSIS — E782 Mixed hyperlipidemia: Secondary | ICD-10-CM | POA: Diagnosis not present

## 2015-10-30 DIAGNOSIS — G894 Chronic pain syndrome: Secondary | ICD-10-CM | POA: Diagnosis not present

## 2015-10-30 DIAGNOSIS — K219 Gastro-esophageal reflux disease without esophagitis: Secondary | ICD-10-CM | POA: Diagnosis not present

## 2015-10-30 DIAGNOSIS — Z6822 Body mass index (BMI) 22.0-22.9, adult: Secondary | ICD-10-CM | POA: Diagnosis not present

## 2015-11-07 DIAGNOSIS — R928 Other abnormal and inconclusive findings on diagnostic imaging of breast: Secondary | ICD-10-CM | POA: Diagnosis not present

## 2015-11-09 DIAGNOSIS — R928 Other abnormal and inconclusive findings on diagnostic imaging of breast: Secondary | ICD-10-CM | POA: Diagnosis not present

## 2016-01-29 DIAGNOSIS — F419 Anxiety disorder, unspecified: Secondary | ICD-10-CM | POA: Diagnosis not present

## 2016-01-29 DIAGNOSIS — Z6822 Body mass index (BMI) 22.0-22.9, adult: Secondary | ICD-10-CM | POA: Diagnosis not present

## 2016-01-29 DIAGNOSIS — Z1389 Encounter for screening for other disorder: Secondary | ICD-10-CM | POA: Diagnosis not present

## 2016-01-29 DIAGNOSIS — G894 Chronic pain syndrome: Secondary | ICD-10-CM | POA: Diagnosis not present

## 2016-01-29 DIAGNOSIS — Z0001 Encounter for general adult medical examination with abnormal findings: Secondary | ICD-10-CM | POA: Diagnosis not present

## 2016-02-15 DIAGNOSIS — E559 Vitamin D deficiency, unspecified: Secondary | ICD-10-CM | POA: Diagnosis not present

## 2016-02-15 DIAGNOSIS — Z0001 Encounter for general adult medical examination with abnormal findings: Secondary | ICD-10-CM | POA: Diagnosis not present

## 2016-02-15 DIAGNOSIS — Z1389 Encounter for screening for other disorder: Secondary | ICD-10-CM | POA: Diagnosis not present

## 2016-02-15 DIAGNOSIS — E782 Mixed hyperlipidemia: Secondary | ICD-10-CM | POA: Diagnosis not present

## 2016-02-15 DIAGNOSIS — Z6822 Body mass index (BMI) 22.0-22.9, adult: Secondary | ICD-10-CM | POA: Diagnosis not present

## 2016-02-15 DIAGNOSIS — R5383 Other fatigue: Secondary | ICD-10-CM | POA: Diagnosis not present

## 2016-04-23 DIAGNOSIS — F419 Anxiety disorder, unspecified: Secondary | ICD-10-CM | POA: Diagnosis not present

## 2016-04-23 DIAGNOSIS — E782 Mixed hyperlipidemia: Secondary | ICD-10-CM | POA: Diagnosis not present

## 2016-04-23 DIAGNOSIS — Z23 Encounter for immunization: Secondary | ICD-10-CM | POA: Diagnosis not present

## 2016-04-23 DIAGNOSIS — G894 Chronic pain syndrome: Secondary | ICD-10-CM | POA: Diagnosis not present

## 2016-04-23 DIAGNOSIS — Z6822 Body mass index (BMI) 22.0-22.9, adult: Secondary | ICD-10-CM | POA: Diagnosis not present

## 2016-04-23 DIAGNOSIS — Z1389 Encounter for screening for other disorder: Secondary | ICD-10-CM | POA: Diagnosis not present

## 2016-04-23 DIAGNOSIS — K219 Gastro-esophageal reflux disease without esophagitis: Secondary | ICD-10-CM | POA: Diagnosis not present

## 2016-07-29 DIAGNOSIS — G894 Chronic pain syndrome: Secondary | ICD-10-CM | POA: Diagnosis not present

## 2016-07-29 DIAGNOSIS — K219 Gastro-esophageal reflux disease without esophagitis: Secondary | ICD-10-CM | POA: Diagnosis not present

## 2016-07-29 DIAGNOSIS — Z1389 Encounter for screening for other disorder: Secondary | ICD-10-CM | POA: Diagnosis not present

## 2016-07-29 DIAGNOSIS — I10 Essential (primary) hypertension: Secondary | ICD-10-CM | POA: Diagnosis not present

## 2016-07-29 DIAGNOSIS — F419 Anxiety disorder, unspecified: Secondary | ICD-10-CM | POA: Diagnosis not present

## 2016-07-29 DIAGNOSIS — Z6822 Body mass index (BMI) 22.0-22.9, adult: Secondary | ICD-10-CM | POA: Diagnosis not present

## 2016-07-29 DIAGNOSIS — Z23 Encounter for immunization: Secondary | ICD-10-CM | POA: Diagnosis not present

## 2016-10-18 DIAGNOSIS — F419 Anxiety disorder, unspecified: Secondary | ICD-10-CM | POA: Diagnosis not present

## 2016-10-18 DIAGNOSIS — K219 Gastro-esophageal reflux disease without esophagitis: Secondary | ICD-10-CM | POA: Diagnosis not present

## 2016-10-18 DIAGNOSIS — Z6822 Body mass index (BMI) 22.0-22.9, adult: Secondary | ICD-10-CM | POA: Diagnosis not present

## 2016-10-18 DIAGNOSIS — G894 Chronic pain syndrome: Secondary | ICD-10-CM | POA: Diagnosis not present

## 2016-10-18 DIAGNOSIS — I10 Essential (primary) hypertension: Secondary | ICD-10-CM | POA: Diagnosis not present

## 2016-10-18 DIAGNOSIS — M791 Myalgia: Secondary | ICD-10-CM | POA: Diagnosis not present

## 2016-10-28 DIAGNOSIS — Z6822 Body mass index (BMI) 22.0-22.9, adult: Secondary | ICD-10-CM | POA: Diagnosis not present

## 2016-10-28 DIAGNOSIS — K219 Gastro-esophageal reflux disease without esophagitis: Secondary | ICD-10-CM | POA: Diagnosis not present

## 2016-10-28 DIAGNOSIS — G894 Chronic pain syndrome: Secondary | ICD-10-CM | POA: Diagnosis not present

## 2016-10-28 DIAGNOSIS — F17290 Nicotine dependence, other tobacco product, uncomplicated: Secondary | ICD-10-CM | POA: Diagnosis not present

## 2016-10-28 DIAGNOSIS — F419 Anxiety disorder, unspecified: Secondary | ICD-10-CM | POA: Diagnosis not present

## 2016-10-31 DIAGNOSIS — Z1231 Encounter for screening mammogram for malignant neoplasm of breast: Secondary | ICD-10-CM | POA: Diagnosis not present

## 2017-01-10 DIAGNOSIS — M1611 Unilateral primary osteoarthritis, right hip: Secondary | ICD-10-CM | POA: Diagnosis not present

## 2017-02-03 DIAGNOSIS — F419 Anxiety disorder, unspecified: Secondary | ICD-10-CM | POA: Diagnosis not present

## 2017-02-03 DIAGNOSIS — Z6822 Body mass index (BMI) 22.0-22.9, adult: Secondary | ICD-10-CM | POA: Diagnosis not present

## 2017-02-03 DIAGNOSIS — K219 Gastro-esophageal reflux disease without esophagitis: Secondary | ICD-10-CM | POA: Diagnosis not present

## 2017-02-03 DIAGNOSIS — G894 Chronic pain syndrome: Secondary | ICD-10-CM | POA: Diagnosis not present

## 2017-02-03 DIAGNOSIS — M1991 Primary osteoarthritis, unspecified site: Secondary | ICD-10-CM | POA: Diagnosis not present

## 2017-02-20 DIAGNOSIS — M1611 Unilateral primary osteoarthritis, right hip: Secondary | ICD-10-CM | POA: Diagnosis not present

## 2017-02-27 DIAGNOSIS — M1611 Unilateral primary osteoarthritis, right hip: Secondary | ICD-10-CM | POA: Diagnosis not present

## 2017-02-27 DIAGNOSIS — M1712 Unilateral primary osteoarthritis, left knee: Secondary | ICD-10-CM | POA: Diagnosis not present

## 2017-02-27 DIAGNOSIS — Z720 Tobacco use: Secondary | ICD-10-CM | POA: Diagnosis not present

## 2017-10-02 DIAGNOSIS — D485 Neoplasm of uncertain behavior of skin: Secondary | ICD-10-CM | POA: Diagnosis not present

## 2017-10-02 DIAGNOSIS — L821 Other seborrheic keratosis: Secondary | ICD-10-CM | POA: Diagnosis not present

## 2018-08-24 ENCOUNTER — Ambulatory Visit: Admit: 2018-08-24 | Discharge: 2018-08-25 | Payer: MEDICARE

## 2018-08-24 DIAGNOSIS — C50911 Malignant neoplasm of unspecified site of right female breast: Principal | ICD-10-CM

## 2018-08-25 ENCOUNTER — Encounter: Admit: 2018-08-25 | Discharge: 2018-08-25 | Payer: MEDICARE | Attending: Surgical Oncology | Primary: Surgical Oncology

## 2018-08-25 DIAGNOSIS — C50919 Malignant neoplasm of unspecified site of unspecified female breast: Principal | ICD-10-CM

## 2018-08-26 ENCOUNTER — Ambulatory Visit: Admit: 2018-08-26 | Discharge: 2018-08-27 | Payer: MEDICARE

## 2018-08-26 ENCOUNTER — Ambulatory Visit
Admit: 2018-08-26 | Discharge: 2018-09-05 | Payer: MEDICARE | Attending: Radiation Oncology | Primary: Radiation Oncology

## 2018-08-26 ENCOUNTER — Ambulatory Visit: Admit: 2018-08-26 | Discharge: 2018-09-05 | Payer: MEDICARE

## 2018-08-26 ENCOUNTER — Ambulatory Visit: Admit: 2018-08-26 | Discharge: 2018-08-27 | Payer: MEDICARE | Attending: Surgical Oncology | Primary: Surgical Oncology

## 2018-08-26 ENCOUNTER — Ambulatory Visit
Admit: 2018-08-26 | Discharge: 2018-08-27 | Payer: MEDICARE | Attending: Hematology & Oncology | Primary: Hematology & Oncology

## 2018-08-26 DIAGNOSIS — R928 Other abnormal and inconclusive findings on diagnostic imaging of breast: Principal | ICD-10-CM

## 2018-08-26 DIAGNOSIS — C50911 Malignant neoplasm of unspecified site of right female breast: Secondary | ICD-10-CM

## 2018-08-26 DIAGNOSIS — C50919 Malignant neoplasm of unspecified site of unspecified female breast: Principal | ICD-10-CM

## 2018-08-26 DIAGNOSIS — Z170 Estrogen receptor positive status [ER+]: Secondary | ICD-10-CM

## 2018-08-26 DIAGNOSIS — C50011 Malignant neoplasm of nipple and areola, right female breast: Secondary | ICD-10-CM

## 2018-09-01 ENCOUNTER — Ambulatory Visit (INDEPENDENT_AMBULATORY_CARE_PROVIDER_SITE_OTHER): Payer: BLUE CROSS/BLUE SHIELD | Admitting: Internal Medicine

## 2018-09-02 ENCOUNTER — Ambulatory Visit: Admit: 2018-09-02 | Discharge: 2018-09-03 | Payer: MEDICARE | Attending: Surgical Oncology | Primary: Surgical Oncology

## 2018-09-02 ENCOUNTER — Ambulatory Visit
Admit: 2018-09-02 | Discharge: 2018-09-03 | Payer: MEDICARE | Attending: Hematology & Oncology | Primary: Hematology & Oncology

## 2018-09-02 DIAGNOSIS — C50911 Malignant neoplasm of unspecified site of right female breast: Principal | ICD-10-CM

## 2018-09-02 DIAGNOSIS — Z87891 Personal history of nicotine dependence: Secondary | ICD-10-CM

## 2018-09-02 DIAGNOSIS — F17200 Nicotine dependence, unspecified, uncomplicated: Secondary | ICD-10-CM

## 2018-09-02 DIAGNOSIS — Z170 Estrogen receptor positive status [ER+]: Secondary | ICD-10-CM

## 2018-09-02 DIAGNOSIS — R799 Abnormal finding of blood chemistry, unspecified: Secondary | ICD-10-CM

## 2018-09-07 ENCOUNTER — Ambulatory Visit: Admit: 2018-09-07 | Discharge: 2018-09-08 | Payer: MEDICARE

## 2018-09-07 DIAGNOSIS — R928 Other abnormal and inconclusive findings on diagnostic imaging of breast: Principal | ICD-10-CM

## 2018-09-07 DIAGNOSIS — N6022 Fibroadenosis of left breast: Principal | ICD-10-CM

## 2018-09-08 ENCOUNTER — Ambulatory Visit: Admit: 2018-09-08 | Discharge: 2018-09-09 | Payer: MEDICARE

## 2018-09-08 DIAGNOSIS — Z170 Estrogen receptor positive status [ER+]: Principal | ICD-10-CM

## 2018-09-08 DIAGNOSIS — C50911 Malignant neoplasm of unspecified site of right female breast: Principal | ICD-10-CM

## 2018-09-08 DIAGNOSIS — Z87891 Personal history of nicotine dependence: Principal | ICD-10-CM

## 2018-09-10 DIAGNOSIS — R911 Solitary pulmonary nodule: Principal | ICD-10-CM

## 2018-09-10 DIAGNOSIS — Z170 Estrogen receptor positive status [ER+]: Principal | ICD-10-CM

## 2018-09-10 DIAGNOSIS — C50911 Malignant neoplasm of unspecified site of right female breast: Principal | ICD-10-CM

## 2018-09-16 ENCOUNTER — Ambulatory Visit: Admit: 2018-09-16 | Discharge: 2018-09-17 | Payer: MEDICARE

## 2018-09-16 DIAGNOSIS — C50911 Malignant neoplasm of unspecified site of right female breast: Principal | ICD-10-CM

## 2018-09-16 DIAGNOSIS — R911 Solitary pulmonary nodule: Principal | ICD-10-CM

## 2018-09-16 DIAGNOSIS — Z170 Estrogen receptor positive status [ER+]: Principal | ICD-10-CM

## 2018-09-17 ENCOUNTER — Ambulatory Visit: Admit: 2018-09-17 | Discharge: 2018-09-17 | Payer: MEDICARE

## 2018-09-17 DIAGNOSIS — E785 Hyperlipidemia, unspecified: Principal | ICD-10-CM

## 2018-09-17 DIAGNOSIS — C50911 Malignant neoplasm of unspecified site of right female breast: Principal | ICD-10-CM

## 2018-09-17 DIAGNOSIS — F17210 Nicotine dependence, cigarettes, uncomplicated: Principal | ICD-10-CM

## 2018-09-17 DIAGNOSIS — K219 Gastro-esophageal reflux disease without esophagitis: Principal | ICD-10-CM

## 2018-09-17 DIAGNOSIS — G8918 Other acute postprocedural pain: Principal | ICD-10-CM

## 2018-09-17 DIAGNOSIS — Z853 Personal history of malignant neoplasm of breast: Principal | ICD-10-CM

## 2018-09-17 DIAGNOSIS — Z170 Estrogen receptor positive status [ER+]: Principal | ICD-10-CM

## 2018-09-17 DIAGNOSIS — I10 Essential (primary) hypertension: Principal | ICD-10-CM

## 2018-09-18 ENCOUNTER — Encounter
Admit: 2018-09-18 | Discharge: 2018-09-19 | Payer: MEDICARE | Attending: Student in an Organized Health Care Education/Training Program | Primary: Student in an Organized Health Care Education/Training Program

## 2018-09-18 ENCOUNTER — Ambulatory Visit: Admit: 2018-09-18 | Discharge: 2018-09-19 | Payer: MEDICARE

## 2018-09-18 DIAGNOSIS — K219 Gastro-esophageal reflux disease without esophagitis: Principal | ICD-10-CM

## 2018-09-18 DIAGNOSIS — Z853 Personal history of malignant neoplasm of breast: Principal | ICD-10-CM

## 2018-09-18 DIAGNOSIS — I10 Essential (primary) hypertension: Principal | ICD-10-CM

## 2018-09-18 DIAGNOSIS — G8918 Other acute postprocedural pain: Principal | ICD-10-CM

## 2018-09-18 DIAGNOSIS — Z170 Estrogen receptor positive status [ER+]: Principal | ICD-10-CM

## 2018-09-18 DIAGNOSIS — C50911 Malignant neoplasm of unspecified site of right female breast: Principal | ICD-10-CM

## 2018-09-18 DIAGNOSIS — E785 Hyperlipidemia, unspecified: Principal | ICD-10-CM

## 2018-09-18 DIAGNOSIS — F17210 Nicotine dependence, cigarettes, uncomplicated: Principal | ICD-10-CM

## 2018-09-21 DIAGNOSIS — E785 Hyperlipidemia, unspecified: Principal | ICD-10-CM

## 2018-09-21 DIAGNOSIS — F319 Bipolar disorder, unspecified: Principal | ICD-10-CM

## 2018-09-21 DIAGNOSIS — K219 Gastro-esophageal reflux disease without esophagitis: Principal | ICD-10-CM

## 2018-09-21 DIAGNOSIS — I10 Essential (primary) hypertension: Principal | ICD-10-CM

## 2018-09-21 DIAGNOSIS — C50919 Malignant neoplasm of unspecified site of unspecified female breast: Principal | ICD-10-CM

## 2018-09-21 DIAGNOSIS — N200 Calculus of kidney: Principal | ICD-10-CM

## 2018-09-28 DIAGNOSIS — Z170 Estrogen receptor positive status [ER+]: Principal | ICD-10-CM

## 2018-09-28 DIAGNOSIS — C50911 Malignant neoplasm of unspecified site of right female breast: Principal | ICD-10-CM

## 2018-09-29 ENCOUNTER — Ambulatory Visit
Admit: 2018-09-29 | Discharge: 2018-09-30 | Payer: MEDICARE | Attending: Nurse Practitioner | Primary: Nurse Practitioner

## 2018-09-29 DIAGNOSIS — C50911 Malignant neoplasm of unspecified site of right female breast: Principal | ICD-10-CM

## 2018-09-29 DIAGNOSIS — Z170 Estrogen receptor positive status [ER+]: Principal | ICD-10-CM

## 2018-09-29 DIAGNOSIS — N200 Calculus of kidney: Principal | ICD-10-CM

## 2018-09-29 DIAGNOSIS — C50919 Malignant neoplasm of unspecified site of unspecified female breast: Principal | ICD-10-CM

## 2018-09-29 DIAGNOSIS — K219 Gastro-esophageal reflux disease without esophagitis: Principal | ICD-10-CM

## 2018-09-29 DIAGNOSIS — I10 Essential (primary) hypertension: Principal | ICD-10-CM

## 2018-09-29 DIAGNOSIS — C50111 Malignant neoplasm of central portion of right female breast: Principal | ICD-10-CM

## 2018-09-29 DIAGNOSIS — F319 Bipolar disorder, unspecified: Principal | ICD-10-CM

## 2018-09-29 DIAGNOSIS — E785 Hyperlipidemia, unspecified: Principal | ICD-10-CM

## 2018-10-06 DIAGNOSIS — E785 Hyperlipidemia, unspecified: Principal | ICD-10-CM

## 2018-10-06 DIAGNOSIS — F319 Bipolar disorder, unspecified: Principal | ICD-10-CM

## 2018-10-06 DIAGNOSIS — N200 Calculus of kidney: Principal | ICD-10-CM

## 2018-10-06 DIAGNOSIS — K219 Gastro-esophageal reflux disease without esophagitis: Principal | ICD-10-CM

## 2018-10-06 DIAGNOSIS — C50919 Malignant neoplasm of unspecified site of unspecified female breast: Principal | ICD-10-CM

## 2018-10-06 DIAGNOSIS — I10 Essential (primary) hypertension: Principal | ICD-10-CM

## 2018-10-08 ENCOUNTER — Ambulatory Visit
Admit: 2018-10-08 | Discharge: 2018-10-08 | Payer: MEDICARE | Attending: Hematology & Oncology | Primary: Hematology & Oncology

## 2018-10-08 ENCOUNTER — Ambulatory Visit: Admit: 2018-10-08 | Discharge: 2018-10-08 | Payer: MEDICARE

## 2018-10-08 DIAGNOSIS — C50911 Malignant neoplasm of unspecified site of right female breast: Principal | ICD-10-CM

## 2018-10-08 DIAGNOSIS — C7951 Secondary malignant neoplasm of bone: Secondary | ICD-10-CM

## 2018-10-08 DIAGNOSIS — Z170 Estrogen receptor positive status [ER+]: Secondary | ICD-10-CM

## 2018-10-08 MED ORDER — LETROZOLE 2.5 MG TABLET
ORAL_TABLET | Freq: Every day | ORAL | 2 refills | 0.00000 days | Status: CP
Start: 2018-10-08 — End: 2018-12-30

## 2018-10-09 ENCOUNTER — Ambulatory Visit: Admit: 2018-10-09 | Discharge: 2018-10-10 | Payer: MEDICARE

## 2018-10-09 DIAGNOSIS — C50911 Malignant neoplasm of unspecified site of right female breast: Principal | ICD-10-CM

## 2018-10-09 DIAGNOSIS — Z170 Estrogen receptor positive status [ER+]: Secondary | ICD-10-CM

## 2018-10-09 DIAGNOSIS — C7951 Secondary malignant neoplasm of bone: Secondary | ICD-10-CM

## 2018-10-15 MED ORDER — PALBOCICLIB 125 MG CAPSULE
ORAL_CAPSULE | Freq: Every day | ORAL | 11 refills | 0 days | Status: CP
Start: 2018-10-15 — End: 2018-11-14

## 2018-11-02 ENCOUNTER — Ambulatory Visit: Admit: 2018-11-02 | Discharge: 2018-11-02 | Payer: MEDICARE

## 2018-11-02 ENCOUNTER — Institutional Professional Consult (permissible substitution): Admit: 2018-11-02 | Discharge: 2018-11-02 | Payer: MEDICARE

## 2018-11-02 ENCOUNTER — Ambulatory Visit
Admit: 2018-11-02 | Discharge: 2018-11-02 | Payer: MEDICARE | Attending: Hematology & Oncology | Primary: Hematology & Oncology

## 2018-11-02 DIAGNOSIS — C50911 Malignant neoplasm of unspecified site of right female breast: Secondary | ICD-10-CM

## 2018-11-02 DIAGNOSIS — Z170 Estrogen receptor positive status [ER+]: Secondary | ICD-10-CM

## 2018-11-02 DIAGNOSIS — C7951 Secondary malignant neoplasm of bone: Principal | ICD-10-CM

## 2018-12-01 ENCOUNTER — Ambulatory Visit: Admit: 2018-12-01 | Discharge: 2018-12-01 | Payer: MEDICARE

## 2018-12-01 ENCOUNTER — Institutional Professional Consult (permissible substitution): Admit: 2018-12-01 | Discharge: 2018-12-01 | Payer: MEDICARE

## 2018-12-01 DIAGNOSIS — C7951 Secondary malignant neoplasm of bone: Principal | ICD-10-CM

## 2018-12-01 DIAGNOSIS — Z5112 Encounter for antineoplastic immunotherapy: Principal | ICD-10-CM

## 2018-12-01 DIAGNOSIS — C50911 Malignant neoplasm of unspecified site of right female breast: Secondary | ICD-10-CM

## 2018-12-01 DIAGNOSIS — Z170 Estrogen receptor positive status [ER+]: Secondary | ICD-10-CM

## 2018-12-28 ENCOUNTER — Ambulatory Visit: Admit: 2018-12-28 | Discharge: 2018-12-28 | Payer: MEDICARE

## 2018-12-28 ENCOUNTER — Institutional Professional Consult (permissible substitution): Admit: 2018-12-28 | Discharge: 2018-12-28 | Payer: MEDICARE

## 2018-12-28 DIAGNOSIS — C50911 Malignant neoplasm of unspecified site of right female breast: Principal | ICD-10-CM

## 2018-12-28 DIAGNOSIS — Z170 Estrogen receptor positive status [ER+]: Secondary | ICD-10-CM

## 2018-12-28 DIAGNOSIS — R918 Other nonspecific abnormal finding of lung field: Secondary | ICD-10-CM

## 2018-12-28 DIAGNOSIS — C7951 Secondary malignant neoplasm of bone: Principal | ICD-10-CM

## 2018-12-30 MED ORDER — LETROZOLE 2.5 MG TABLET
ORAL_TABLET | 0 refills | 0 days | Status: CP
Start: 2018-12-30 — End: ?

## 2019-01-26 ENCOUNTER — Ambulatory Visit: Admit: 2019-01-26 | Discharge: 2019-01-26 | Payer: MEDICARE

## 2019-01-26 ENCOUNTER — Institutional Professional Consult (permissible substitution): Admit: 2019-01-26 | Discharge: 2019-01-26 | Payer: MEDICARE

## 2019-01-26 DIAGNOSIS — Z170 Estrogen receptor positive status [ER+]: Secondary | ICD-10-CM

## 2019-01-26 DIAGNOSIS — C7951 Secondary malignant neoplasm of bone: Principal | ICD-10-CM

## 2019-01-26 DIAGNOSIS — C50911 Malignant neoplasm of unspecified site of right female breast: Secondary | ICD-10-CM

## 2019-02-25 ENCOUNTER — Institutional Professional Consult (permissible substitution): Admit: 2019-02-25 | Discharge: 2019-02-25 | Payer: MEDICARE

## 2019-02-25 ENCOUNTER — Ambulatory Visit: Admit: 2019-02-25 | Discharge: 2019-02-25 | Payer: MEDICARE

## 2019-02-25 DIAGNOSIS — C7951 Secondary malignant neoplasm of bone: Principal | ICD-10-CM

## 2019-02-25 DIAGNOSIS — Z170 Estrogen receptor positive status [ER+]: Secondary | ICD-10-CM

## 2019-02-25 DIAGNOSIS — C50911 Malignant neoplasm of unspecified site of right female breast: Secondary | ICD-10-CM

## 2019-03-23 ENCOUNTER — Ambulatory Visit: Admit: 2019-03-23 | Discharge: 2019-03-24 | Payer: MEDICARE

## 2019-03-23 ENCOUNTER — Ambulatory Visit
Admit: 2019-03-23 | Discharge: 2019-03-24 | Payer: MEDICARE | Attending: Hematology & Oncology | Primary: Hematology & Oncology

## 2019-03-23 ENCOUNTER — Institutional Professional Consult (permissible substitution): Admit: 2019-03-23 | Discharge: 2019-03-24 | Payer: MEDICARE

## 2019-03-23 DIAGNOSIS — Z08 Encounter for follow-up examination after completed treatment for malignant neoplasm: Secondary | ICD-10-CM

## 2019-03-23 DIAGNOSIS — M25551 Pain in right hip: Secondary | ICD-10-CM

## 2019-03-23 DIAGNOSIS — F319 Bipolar disorder, unspecified: Secondary | ICD-10-CM

## 2019-03-23 DIAGNOSIS — Z79899 Other long term (current) drug therapy: Secondary | ICD-10-CM

## 2019-03-23 DIAGNOSIS — E785 Hyperlipidemia, unspecified: Secondary | ICD-10-CM

## 2019-03-23 DIAGNOSIS — K219 Gastro-esophageal reflux disease without esophagitis: Secondary | ICD-10-CM

## 2019-03-23 DIAGNOSIS — Z170 Estrogen receptor positive status [ER+]: Secondary | ICD-10-CM

## 2019-03-23 DIAGNOSIS — Z923 Personal history of irradiation: Secondary | ICD-10-CM

## 2019-03-23 DIAGNOSIS — Z9011 Acquired absence of right breast and nipple: Secondary | ICD-10-CM

## 2019-03-23 DIAGNOSIS — F17210 Nicotine dependence, cigarettes, uncomplicated: Secondary | ICD-10-CM

## 2019-03-23 DIAGNOSIS — C7951 Secondary malignant neoplasm of bone: Secondary | ICD-10-CM

## 2019-03-23 DIAGNOSIS — C50911 Malignant neoplasm of unspecified site of right female breast: Secondary | ICD-10-CM

## 2019-03-23 DIAGNOSIS — Z23 Encounter for immunization: Secondary | ICD-10-CM

## 2019-03-23 DIAGNOSIS — Z79811 Long term (current) use of aromatase inhibitors: Secondary | ICD-10-CM

## 2019-03-23 DIAGNOSIS — I10 Essential (primary) hypertension: Secondary | ICD-10-CM

## 2019-03-23 MED ORDER — LETROZOLE 2.5 MG TABLET
ORAL_TABLET | 0 refills | 0 days | Status: CP
Start: 2019-03-23 — End: ?

## 2019-04-28 ENCOUNTER — Institutional Professional Consult (permissible substitution): Admit: 2019-04-28 | Discharge: 2019-04-28 | Payer: MEDICARE

## 2019-04-28 ENCOUNTER — Ambulatory Visit: Admit: 2019-04-28 | Discharge: 2019-04-28 | Payer: MEDICARE

## 2019-04-28 DIAGNOSIS — Z170 Estrogen receptor positive status [ER+]: Principal | ICD-10-CM

## 2019-04-28 DIAGNOSIS — C50911 Malignant neoplasm of unspecified site of right female breast: Principal | ICD-10-CM

## 2019-04-28 DIAGNOSIS — C7951 Secondary malignant neoplasm of bone: Principal | ICD-10-CM

## 2019-05-25 ENCOUNTER — Other Ambulatory Visit: Payer: Self-pay | Admitting: Internal Medicine

## 2019-05-25 ENCOUNTER — Other Ambulatory Visit (HOSPITAL_COMMUNITY): Payer: Self-pay | Admitting: Internal Medicine

## 2019-05-25 DIAGNOSIS — M79605 Pain in left leg: Secondary | ICD-10-CM

## 2019-06-16 DIAGNOSIS — C50911 Malignant neoplasm of unspecified site of right female breast: Principal | ICD-10-CM

## 2019-06-16 DIAGNOSIS — Z170 Estrogen receptor positive status [ER+]: Principal | ICD-10-CM

## 2019-06-16 MED ORDER — LETROZOLE 2.5 MG TABLET
ORAL_TABLET | 0 refills | 0 days | Status: CP
Start: 2019-06-16 — End: ?

## 2019-07-08 ENCOUNTER — Ambulatory Visit: Admit: 2019-07-08 | Discharge: 2019-07-08 | Payer: MEDICARE

## 2019-07-08 ENCOUNTER — Institutional Professional Consult (permissible substitution): Admit: 2019-07-08 | Discharge: 2019-07-08 | Payer: MEDICARE

## 2019-07-08 DIAGNOSIS — C50911 Malignant neoplasm of unspecified site of right female breast: Secondary | ICD-10-CM

## 2019-07-08 DIAGNOSIS — Z170 Estrogen receptor positive status [ER+]: Principal | ICD-10-CM

## 2019-07-08 DIAGNOSIS — C7951 Secondary malignant neoplasm of bone: Principal | ICD-10-CM

## 2019-08-02 ENCOUNTER — Ambulatory Visit
Admit: 2019-08-02 | Discharge: 2019-08-02 | Payer: MEDICARE | Attending: Hematology & Oncology | Primary: Hematology & Oncology

## 2019-08-02 ENCOUNTER — Ambulatory Visit: Admit: 2019-08-02 | Discharge: 2019-08-02 | Payer: MEDICARE

## 2019-08-02 DIAGNOSIS — Z170 Estrogen receptor positive status [ER+]: Principal | ICD-10-CM

## 2019-08-02 DIAGNOSIS — C50911 Malignant neoplasm of unspecified site of right female breast: Principal | ICD-10-CM

## 2019-09-02 ENCOUNTER — Ambulatory Visit: Admit: 2019-09-02 | Discharge: 2019-09-02 | Payer: MEDICARE

## 2019-09-02 ENCOUNTER — Institutional Professional Consult (permissible substitution): Admit: 2019-09-02 | Discharge: 2019-09-02 | Payer: MEDICARE

## 2019-09-02 DIAGNOSIS — C50911 Malignant neoplasm of unspecified site of right female breast: Secondary | ICD-10-CM

## 2019-09-02 DIAGNOSIS — Z170 Estrogen receptor positive status [ER+]: Principal | ICD-10-CM

## 2019-09-02 DIAGNOSIS — C7951 Secondary malignant neoplasm of bone: Principal | ICD-10-CM

## 2019-09-06 DIAGNOSIS — C7951 Secondary malignant neoplasm of bone: Principal | ICD-10-CM

## 2019-09-06 DIAGNOSIS — Z170 Estrogen receptor positive status [ER+]: Principal | ICD-10-CM

## 2019-09-06 DIAGNOSIS — C50911 Malignant neoplasm of unspecified site of right female breast: Principal | ICD-10-CM

## 2019-09-06 MED ORDER — IBRANCE 125 MG CAPSULE
ORAL_CAPSULE | 5 refills | 0 days | Status: CP
Start: 2019-09-06 — End: ?

## 2019-09-07 DIAGNOSIS — C50911 Malignant neoplasm of unspecified site of right female breast: Principal | ICD-10-CM

## 2019-09-07 DIAGNOSIS — Z170 Estrogen receptor positive status [ER+]: Principal | ICD-10-CM

## 2019-09-07 MED ORDER — LETROZOLE 2.5 MG TABLET
ORAL_TABLET | 3 refills | 0 days | Status: CP
Start: 2019-09-07 — End: ?

## 2019-09-30 ENCOUNTER — Ambulatory Visit: Admit: 2019-09-30 | Discharge: 2019-10-01 | Payer: MEDICARE

## 2019-09-30 ENCOUNTER — Institutional Professional Consult (permissible substitution): Admit: 2019-09-30 | Discharge: 2019-10-01 | Payer: MEDICARE

## 2019-09-30 DIAGNOSIS — C50911 Malignant neoplasm of unspecified site of right female breast: Principal | ICD-10-CM

## 2019-09-30 DIAGNOSIS — Z170 Estrogen receptor positive status [ER+]: Secondary | ICD-10-CM

## 2019-09-30 DIAGNOSIS — C7951 Secondary malignant neoplasm of bone: Principal | ICD-10-CM

## 2019-11-25 ENCOUNTER — Institutional Professional Consult (permissible substitution): Admit: 2019-11-25 | Discharge: 2019-11-25 | Payer: MEDICARE

## 2019-11-25 ENCOUNTER — Ambulatory Visit: Admit: 2019-11-25 | Discharge: 2019-11-25 | Payer: MEDICARE

## 2019-11-25 DIAGNOSIS — C50911 Malignant neoplasm of unspecified site of right female breast: Secondary | ICD-10-CM

## 2019-11-25 DIAGNOSIS — C7951 Secondary malignant neoplasm of bone: Principal | ICD-10-CM

## 2019-11-25 DIAGNOSIS — Z170 Estrogen receptor positive status [ER+]: Principal | ICD-10-CM

## 2019-12-24 ENCOUNTER — Ambulatory Visit: Admit: 2019-12-24 | Discharge: 2019-12-25 | Payer: MEDICARE

## 2019-12-24 DIAGNOSIS — Z170 Estrogen receptor positive status [ER+]: Principal | ICD-10-CM

## 2019-12-24 DIAGNOSIS — F17200 Nicotine dependence, unspecified, uncomplicated: Principal | ICD-10-CM

## 2019-12-24 DIAGNOSIS — C50911 Malignant neoplasm of unspecified site of right female breast: Principal | ICD-10-CM

## 2019-12-24 DIAGNOSIS — L989 Disorder of the skin and subcutaneous tissue, unspecified: Principal | ICD-10-CM

## 2020-01-04 ENCOUNTER — Ambulatory Visit: Admit: 2020-01-04 | Discharge: 2020-01-17 | Payer: MEDICARE

## 2020-01-04 ENCOUNTER — Ambulatory Visit: Admit: 2020-01-04 | Discharge: 2020-01-05 | Payer: MEDICARE

## 2020-01-06 DIAGNOSIS — R918 Other nonspecific abnormal finding of lung field: Principal | ICD-10-CM

## 2020-01-12 ENCOUNTER — Ambulatory Visit: Admit: 2020-01-12 | Discharge: 2020-01-12 | Payer: MEDICARE

## 2020-01-12 DIAGNOSIS — R918 Other nonspecific abnormal finding of lung field: Principal | ICD-10-CM

## 2020-01-14 DIAGNOSIS — R918 Other nonspecific abnormal finding of lung field: Principal | ICD-10-CM

## 2020-01-14 DIAGNOSIS — C50011 Malignant neoplasm of nipple and areola, right female breast: Principal | ICD-10-CM

## 2020-01-14 DIAGNOSIS — C3490 Malignant neoplasm of unspecified part of unspecified bronchus or lung: Principal | ICD-10-CM

## 2020-01-14 DIAGNOSIS — C7951 Secondary malignant neoplasm of bone: Principal | ICD-10-CM

## 2020-01-14 DIAGNOSIS — C3411 Malignant neoplasm of upper lobe, right bronchus or lung: Principal | ICD-10-CM

## 2020-01-21 ENCOUNTER — Ambulatory Visit: Admit: 2020-01-21 | Discharge: 2020-01-22 | Payer: MEDICARE

## 2020-01-21 ENCOUNTER — Other Ambulatory Visit: Admit: 2020-01-21 | Discharge: 2020-01-22 | Payer: MEDICARE

## 2020-01-21 DIAGNOSIS — C50911 Malignant neoplasm of unspecified site of right female breast: Principal | ICD-10-CM

## 2020-01-21 DIAGNOSIS — Z170 Estrogen receptor positive status [ER+]: Principal | ICD-10-CM

## 2020-01-21 DIAGNOSIS — C7951 Secondary malignant neoplasm of bone: Principal | ICD-10-CM

## 2020-01-27 ENCOUNTER — Ambulatory Visit: Admit: 2020-01-27 | Discharge: 2020-01-28 | Payer: MEDICARE

## 2020-02-03 ENCOUNTER — Ambulatory Visit: Admit: 2020-02-03 | Discharge: 2020-02-04 | Payer: MEDICARE

## 2020-02-04 ENCOUNTER — Ambulatory Visit: Admit: 2020-02-04 | Discharge: 2020-02-05 | Payer: MEDICARE

## 2020-02-08 ENCOUNTER — Ambulatory Visit: Admit: 2020-02-08 | Discharge: 2020-02-09 | Payer: MEDICARE

## 2020-02-08 DIAGNOSIS — C3411 Malignant neoplasm of upper lobe, right bronchus or lung: Principal | ICD-10-CM

## 2020-02-18 ENCOUNTER — Ambulatory Visit: Admit: 2020-02-18 | Discharge: 2020-02-21 | Disposition: A | Discharge status: Home / Self Care | Payer: MEDICARE

## 2020-02-18 ENCOUNTER — Encounter
Admit: 2020-02-18 | Discharge: 2020-02-21 | Disposition: A | Discharge status: Home / Self Care | Payer: MEDICARE | Attending: Student in an Organized Health Care Education/Training Program

## 2020-02-18 ENCOUNTER — Ambulatory Visit
Admit: 2020-02-18 | Discharge: 2020-02-21 | Disposition: A | Discharge status: Home / Self Care | Payer: MEDICARE | Attending: Physician Assistant | Primary: Physician Assistant

## 2020-02-21 MED ORDER — POLYETHYLENE GLYCOL 3350 17 GRAM/DOSE ORAL POWDER
Freq: Every day | ORAL | 0 refills | 14 days | Status: CP
Start: 2020-02-21 — End: 2020-02-28

## 2020-02-21 MED ORDER — DOCUSATE SODIUM 100 MG CAPSULE
ORAL_CAPSULE | Freq: Two times a day (BID) | ORAL | 0 refills | 30.00000 days | Status: CP
Start: 2020-02-21 — End: 2020-03-22

## 2020-02-21 MED ORDER — NICOTINE 14 MG/24 HR DAILY TRANSDERMAL PATCH
MEDICATED_PATCH | Freq: Every day | TRANSDERMAL | 0 refills | 28.00000 days | Status: CP
Start: 2020-02-21 — End: ?

## 2020-02-21 MED ORDER — ACETAMINOPHEN 325 MG TABLET
ORAL_TABLET | ORAL | 0 refills | 14 days | Status: CP
Start: 2020-02-21 — End: 2020-03-06

## 2020-03-01 DIAGNOSIS — C50911 Malignant neoplasm of unspecified site of right female breast: Principal | ICD-10-CM

## 2020-03-01 DIAGNOSIS — Z170 Estrogen receptor positive status [ER+]: Principal | ICD-10-CM

## 2020-03-01 DIAGNOSIS — C7951 Secondary malignant neoplasm of bone: Principal | ICD-10-CM

## 2020-03-02 ENCOUNTER — Ambulatory Visit: Admit: 2020-03-02 | Discharge: 2020-03-03 | Payer: MEDICARE | Attending: Adult Health | Primary: Adult Health

## 2020-03-02 ENCOUNTER — Ambulatory Visit: Admit: 2020-03-02 | Discharge: 2020-03-03 | Payer: MEDICARE

## 2020-03-02 DIAGNOSIS — C3491 Malignant neoplasm of unspecified part of right bronchus or lung: Principal | ICD-10-CM

## 2020-03-03 DIAGNOSIS — C7951 Secondary malignant neoplasm of bone: Principal | ICD-10-CM

## 2020-03-03 DIAGNOSIS — Z170 Estrogen receptor positive status [ER+]: Principal | ICD-10-CM

## 2020-03-03 DIAGNOSIS — C50911 Malignant neoplasm of unspecified site of right female breast: Principal | ICD-10-CM

## 2020-03-03 MED ORDER — IBRANCE 125 MG CAPSULE
ORAL_CAPSULE | ORAL | 5 refills | 0.00000 days | Status: CP
Start: 2020-03-03 — End: 2020-03-03

## 2020-03-03 MED ORDER — IBRANCE 125 MG CAPSULE: capsule | 5 refills | 0 days | Status: AC

## 2020-08-18 DIAGNOSIS — C50911 Malignant neoplasm of unspecified site of right female breast: Principal | ICD-10-CM

## 2020-08-18 DIAGNOSIS — Z170 Estrogen receptor positive status [ER+]: Principal | ICD-10-CM

## 2020-08-18 DIAGNOSIS — C7951 Secondary malignant neoplasm of bone: Principal | ICD-10-CM

## 2020-08-19 DIAGNOSIS — Z170 Estrogen receptor positive status [ER+]: Principal | ICD-10-CM

## 2020-08-19 DIAGNOSIS — C50911 Malignant neoplasm of unspecified site of right female breast: Principal | ICD-10-CM

## 2020-08-23 ENCOUNTER — Ambulatory Visit: Admit: 2020-08-23 | Discharge: 2020-08-24 | Payer: MEDICARE

## 2020-08-23 ENCOUNTER — Other Ambulatory Visit: Admit: 2020-08-23 | Discharge: 2020-08-24 | Payer: MEDICARE

## 2020-08-23 DIAGNOSIS — C50911 Malignant neoplasm of unspecified site of right female breast: Principal | ICD-10-CM

## 2020-08-23 DIAGNOSIS — Z170 Estrogen receptor positive status [ER+]: Principal | ICD-10-CM

## 2020-08-23 DIAGNOSIS — C7951 Secondary malignant neoplasm of bone: Principal | ICD-10-CM

## 2020-08-23 DIAGNOSIS — C3491 Malignant neoplasm of unspecified part of right bronchus or lung: Principal | ICD-10-CM

## 2020-08-23 MED ORDER — LETROZOLE 2.5 MG TABLET
ORAL_TABLET | Freq: Every day | ORAL | 3 refills | 90 days | Status: CP
Start: 2020-08-23 — End: ?

## 2020-08-25 MED ORDER — IBRANCE 125 MG CAPSULE
ORAL_CAPSULE | ORAL | 5 refills | 0.00000 days | Status: CP
Start: 2020-08-25 — End: 2020-08-25

## 2020-09-19 ENCOUNTER — Ambulatory Visit: Admit: 2020-09-19 | Discharge: 2020-10-02 | Payer: MEDICARE

## 2020-09-19 ENCOUNTER — Ambulatory Visit: Admit: 2020-09-19 | Discharge: 2020-10-18 | Payer: MEDICARE

## 2020-09-19 DIAGNOSIS — C3491 Malignant neoplasm of unspecified part of right bronchus or lung: Principal | ICD-10-CM

## 2020-09-26 DIAGNOSIS — Z170 Estrogen receptor positive status [ER+]: Principal | ICD-10-CM

## 2020-09-26 DIAGNOSIS — C50911 Malignant neoplasm of unspecified site of right female breast: Principal | ICD-10-CM

## 2020-09-27 ENCOUNTER — Ambulatory Visit: Admit: 2020-09-27 | Discharge: 2020-09-27 | Payer: MEDICARE

## 2020-09-27 ENCOUNTER — Other Ambulatory Visit: Admit: 2020-09-27 | Discharge: 2020-09-27 | Payer: MEDICARE

## 2020-09-27 DIAGNOSIS — Z170 Estrogen receptor positive status [ER+]: Principal | ICD-10-CM

## 2020-09-27 DIAGNOSIS — C50911 Malignant neoplasm of unspecified site of right female breast: Principal | ICD-10-CM

## 2020-09-27 MED ORDER — PALBOCICLIB 125 MG TABLET
ORAL_TABLET | Freq: Every day | ORAL | 5 refills | 21 days | Status: CP
Start: 2020-09-27 — End: ?

## 2020-09-27 MED ORDER — IBRANCE 125 MG CAPSULE
ORAL_CAPSULE | 5 refills | 0 days | Status: CP
Start: 2020-09-27 — End: 2020-09-27

## 2020-10-06 DIAGNOSIS — C7951 Secondary malignant neoplasm of bone: Principal | ICD-10-CM

## 2020-10-06 DIAGNOSIS — Z170 Estrogen receptor positive status [ER+]: Principal | ICD-10-CM

## 2020-10-06 DIAGNOSIS — C3491 Malignant neoplasm of unspecified part of right bronchus or lung: Principal | ICD-10-CM

## 2020-10-06 DIAGNOSIS — C50911 Malignant neoplasm of unspecified site of right female breast: Principal | ICD-10-CM

## 2020-10-11 ENCOUNTER — Other Ambulatory Visit (HOSPITAL_COMMUNITY): Payer: Self-pay | Admitting: Internal Medicine

## 2020-10-11 DIAGNOSIS — M25552 Pain in left hip: Secondary | ICD-10-CM

## 2020-11-22 ENCOUNTER — Ambulatory Visit: Admit: 2020-11-22 | Discharge: 2020-11-23 | Payer: MEDICARE

## 2020-11-22 ENCOUNTER — Institutional Professional Consult (permissible substitution): Admit: 2020-11-22 | Discharge: 2020-11-23 | Payer: MEDICARE

## 2020-11-22 DIAGNOSIS — Z170 Estrogen receptor positive status [ER+]: Principal | ICD-10-CM

## 2020-11-22 DIAGNOSIS — C7951 Secondary malignant neoplasm of bone: Principal | ICD-10-CM

## 2020-11-22 DIAGNOSIS — C50911 Malignant neoplasm of unspecified site of right female breast: Principal | ICD-10-CM

## 2021-02-28 ENCOUNTER — Ambulatory Visit: Admit: 2021-02-28 | Discharge: 2021-03-05 | Payer: MEDICARE

## 2021-02-28 ENCOUNTER — Ambulatory Visit: Admit: 2021-02-28 | Discharge: 2021-02-28 | Payer: MEDICARE

## 2021-02-28 ENCOUNTER — Ambulatory Visit: Admit: 2021-02-28 | Payer: MEDICARE

## 2021-02-28 ENCOUNTER — Ambulatory Visit: Admit: 2021-02-28 | Discharge: 2021-03-02 | Payer: MEDICARE

## 2021-03-07 ENCOUNTER — Institutional Professional Consult (permissible substitution): Admit: 2021-03-07 | Discharge: 2021-03-07 | Payer: MEDICARE

## 2021-03-07 ENCOUNTER — Ambulatory Visit: Admit: 2021-03-07 | Discharge: 2021-03-07 | Payer: MEDICARE

## 2021-03-07 DIAGNOSIS — Z170 Estrogen receptor positive status [ER+]: Principal | ICD-10-CM

## 2021-03-07 DIAGNOSIS — C50911 Malignant neoplasm of unspecified site of right female breast: Principal | ICD-10-CM

## 2021-03-07 DIAGNOSIS — C7951 Secondary malignant neoplasm of bone: Principal | ICD-10-CM

## 2021-03-22 DIAGNOSIS — C50911 Malignant neoplasm of unspecified site of right female breast: Principal | ICD-10-CM

## 2021-03-22 DIAGNOSIS — Z170 Estrogen receptor positive status [ER+]: Principal | ICD-10-CM

## 2021-03-22 MED ORDER — PALBOCICLIB 125 MG TABLET
ORAL_TABLET | Freq: Every day | ORAL | 5 refills | 21.00000 days | Status: CP
Start: 2021-03-22 — End: ?

## 2021-04-03 ENCOUNTER — Telehealth: Admit: 2021-04-03 | Discharge: 2021-04-04 | Payer: MEDICARE

## 2021-04-07 DIAGNOSIS — Z170 Estrogen receptor positive status [ER+]: Principal | ICD-10-CM

## 2021-04-07 DIAGNOSIS — C50911 Malignant neoplasm of unspecified site of right female breast: Principal | ICD-10-CM

## 2021-05-02 ENCOUNTER — Ambulatory Visit: Admit: 2021-05-02 | Discharge: 2021-05-02 | Payer: MEDICARE

## 2021-05-02 ENCOUNTER — Institutional Professional Consult (permissible substitution): Admit: 2021-05-02 | Discharge: 2021-05-02 | Payer: MEDICARE

## 2021-05-02 DIAGNOSIS — C7951 Secondary malignant neoplasm of bone: Principal | ICD-10-CM

## 2021-05-02 DIAGNOSIS — C50911 Malignant neoplasm of unspecified site of right female breast: Principal | ICD-10-CM

## 2021-05-02 DIAGNOSIS — Z170 Estrogen receptor positive status [ER+]: Principal | ICD-10-CM

## 2021-05-09 ENCOUNTER — Ambulatory Visit: Admit: 2021-05-09 | Payer: MEDICARE

## 2021-05-09 ENCOUNTER — Ambulatory Visit: Admit: 2021-05-09 | Payer: MEDICARE | Attending: Hematology & Oncology | Primary: Hematology & Oncology

## 2021-05-11 DIAGNOSIS — C3491 Malignant neoplasm of unspecified part of right bronchus or lung: Principal | ICD-10-CM

## 2021-06-06 DIAGNOSIS — Z170 Estrogen receptor positive status [ER+]: Principal | ICD-10-CM

## 2021-06-06 DIAGNOSIS — C50911 Malignant neoplasm of unspecified site of right female breast: Principal | ICD-10-CM

## 2021-06-07 ENCOUNTER — Ambulatory Visit: Admit: 2021-06-07 | Discharge: 2021-06-07 | Payer: MEDICARE

## 2021-06-07 ENCOUNTER — Ambulatory Visit: Admit: 2021-06-07 | Discharge: 2021-06-08 | Payer: MEDICARE

## 2021-06-12 ENCOUNTER — Telehealth: Admit: 2021-06-12 | Payer: MEDICARE

## 2021-06-15 ENCOUNTER — Ambulatory Visit: Admit: 2021-06-15 | Discharge: 2021-06-16 | Payer: MEDICARE

## 2021-06-15 DIAGNOSIS — C50911 Malignant neoplasm of unspecified site of right female breast: Principal | ICD-10-CM

## 2021-06-15 DIAGNOSIS — Z170 Estrogen receptor positive status [ER+]: Principal | ICD-10-CM

## 2021-06-15 MED ORDER — LETROZOLE 2.5 MG TABLET
ORAL_TABLET | Freq: Every day | ORAL | 3 refills | 90 days | Status: CP
Start: 2021-06-15 — End: ?

## 2021-07-06 ENCOUNTER — Institutional Professional Consult (permissible substitution): Admit: 2021-07-06 | Discharge: 2021-07-06 | Payer: MEDICARE

## 2021-07-06 ENCOUNTER — Ambulatory Visit: Admit: 2021-07-06 | Discharge: 2021-07-06 | Payer: MEDICARE

## 2021-07-06 DIAGNOSIS — Z170 Estrogen receptor positive status [ER+]: Principal | ICD-10-CM

## 2021-07-06 DIAGNOSIS — C7951 Secondary malignant neoplasm of bone: Principal | ICD-10-CM

## 2021-07-06 DIAGNOSIS — C50911 Malignant neoplasm of unspecified site of right female breast: Principal | ICD-10-CM

## 2021-09-04 DIAGNOSIS — Z170 Estrogen receptor positive status [ER+]: Principal | ICD-10-CM

## 2021-09-04 DIAGNOSIS — C50911 Malignant neoplasm of unspecified site of right female breast: Principal | ICD-10-CM

## 2021-09-05 MED ORDER — PALBOCICLIB 125 MG TABLET
ORAL_TABLET | Freq: Every day | ORAL | 5 refills | 21 days | Status: CP
Start: 2021-09-05 — End: ?

## 2021-09-12 ENCOUNTER — Ambulatory Visit: Admit: 2021-09-12 | Payer: MEDICARE

## 2021-09-28 ENCOUNTER — Ambulatory Visit: Admit: 2021-09-28 | Discharge: 2021-09-28 | Payer: MEDICARE

## 2021-09-28 ENCOUNTER — Institutional Professional Consult (permissible substitution): Admit: 2021-09-28 | Discharge: 2021-09-28 | Payer: MEDICARE

## 2021-09-28 DIAGNOSIS — C50911 Malignant neoplasm of unspecified site of right female breast: Principal | ICD-10-CM

## 2021-09-28 DIAGNOSIS — Z170 Estrogen receptor positive status [ER+]: Principal | ICD-10-CM

## 2021-09-28 DIAGNOSIS — C7951 Secondary malignant neoplasm of bone: Principal | ICD-10-CM

## 2021-10-01 ENCOUNTER — Ambulatory Visit: Admit: 2021-10-01 | Discharge: 2021-10-02 | Payer: MEDICARE

## 2021-10-03 DIAGNOSIS — C7951 Secondary malignant neoplasm of bone: Principal | ICD-10-CM

## 2021-10-03 DIAGNOSIS — C3491 Malignant neoplasm of unspecified part of right bronchus or lung: Principal | ICD-10-CM

## 2021-10-03 DIAGNOSIS — Z170 Estrogen receptor positive status [ER+]: Principal | ICD-10-CM

## 2021-10-03 DIAGNOSIS — C50911 Malignant neoplasm of unspecified site of right female breast: Principal | ICD-10-CM

## 2021-10-11 ENCOUNTER — Ambulatory Visit: Admit: 2021-10-11 | Discharge: 2021-10-12 | Payer: MEDICARE

## 2021-10-17 ENCOUNTER — Telehealth: Admit: 2021-10-17 | Discharge: 2021-10-18 | Payer: MEDICARE

## 2021-10-31 ENCOUNTER — Ambulatory Visit: Admit: 2021-10-31 | Payer: MEDICARE

## 2021-11-01 DIAGNOSIS — C7951 Secondary malignant neoplasm of bone: Principal | ICD-10-CM

## 2021-11-01 MED ORDER — ALPELISIB 300 MG/DAY (150 MG X 2) TABLET
ORAL_TABLET | Freq: Every day | ORAL | 3 refills | 0 days | Status: CP
Start: 2021-11-01 — End: ?
  Filled 2021-11-15: qty 56, 28d supply, fill #0

## 2021-11-02 DIAGNOSIS — C7951 Secondary malignant neoplasm of bone: Principal | ICD-10-CM

## 2021-11-02 NOTE — Unmapped (Signed)
[facility] Specialty Medication Onboarding    Specialty Medication: Piqray 300mg /day (150mg  x 2) tablet  Prior Authorization: Approved   Financial Assistance: No - copay  <$25  Final Copay/Day Supply: $3.61 / 28 days    Insurance Restrictions: None     Notes to Pharmacist: N/A    The triage team has completed the benefits investigation and has determined that the patient is able to fill this medication at [facility]. Please contact the patient to complete the onboarding or follow up with the prescribing physician as needed.

## 2021-11-09 ENCOUNTER — Ambulatory Visit: Admit: 2021-11-09 | Discharge: 2021-11-09 | Payer: MEDICARE

## 2021-11-09 ENCOUNTER — Institutional Professional Consult (permissible substitution): Admit: 2021-11-09 | Discharge: 2021-11-09 | Payer: MEDICARE

## 2021-11-09 DIAGNOSIS — C50911 Malignant neoplasm of unspecified site of right female breast: Principal | ICD-10-CM

## 2021-11-09 DIAGNOSIS — C3491 Malignant neoplasm of unspecified part of right bronchus or lung: Principal | ICD-10-CM

## 2021-11-09 DIAGNOSIS — Z170 Estrogen receptor positive status [ER+]: Principal | ICD-10-CM

## 2021-11-09 DIAGNOSIS — C7951 Secondary malignant neoplasm of bone: Principal | ICD-10-CM

## 2021-11-09 LAB — CBC W/ AUTO DIFF
BASOPHILS ABSOLUTE COUNT: 0.1 10*9/L (ref 0.0–0.1)
BASOPHILS RELATIVE PERCENT: 1.5 %
EOSINOPHILS ABSOLUTE COUNT: 0.1 10*9/L (ref 0.0–0.5)
EOSINOPHILS RELATIVE PERCENT: 1.3 %
HEMATOCRIT: 38.6 % (ref 34.0–44.0)
HEMOGLOBIN: 13.1 g/dL (ref 11.3–14.9)
LYMPHOCYTES ABSOLUTE COUNT: 1.2 10*9/L (ref 1.1–3.6)
LYMPHOCYTES RELATIVE PERCENT: 19.3 %
MEAN CORPUSCULAR HEMOGLOBIN CONC: 34 g/dL (ref 32.0–36.0)
MEAN CORPUSCULAR HEMOGLOBIN: 36 pg — ABNORMAL HIGH (ref 25.9–32.4)
MEAN CORPUSCULAR VOLUME: 105.9 fL — ABNORMAL HIGH (ref 77.6–95.7)
MEAN PLATELET VOLUME: 7.7 fL (ref 6.8–10.7)
MONOCYTES ABSOLUTE COUNT: 0.6 10*9/L (ref 0.3–0.8)
MONOCYTES RELATIVE PERCENT: 8.9 %
NEUTROPHILS ABSOLUTE COUNT: 4.5 10*9/L (ref 1.8–7.8)
NEUTROPHILS RELATIVE PERCENT: 69 %
NUCLEATED RED BLOOD CELLS: 0 /100{WBCs} (ref <=4)
PLATELET COUNT: 282 10*9/L (ref 150–450)
RED BLOOD CELL COUNT: 3.65 10*12/L — ABNORMAL LOW (ref 3.95–5.13)
RED CELL DISTRIBUTION WIDTH: 14.1 % (ref 12.2–15.2)
WBC ADJUSTED: 6.4 10*9/L (ref 3.6–11.2)

## 2021-11-09 LAB — COMPREHENSIVE METABOLIC PANEL
ALBUMIN: 3.7 g/dL (ref 3.4–5.0)
ALKALINE PHOSPHATASE: 172 U/L — ABNORMAL HIGH (ref 46–116)
ALT (SGPT): 46 U/L (ref 10–49)
ANION GAP: 5 mmol/L (ref 5–14)
AST (SGOT): 68 U/L — ABNORMAL HIGH (ref <=34)
BILIRUBIN TOTAL: 0.4 mg/dL (ref 0.3–1.2)
BLOOD UREA NITROGEN: 8 mg/dL — ABNORMAL LOW (ref 9–23)
BUN / CREAT RATIO: 13
CALCIUM: 9.8 mg/dL (ref 8.7–10.4)
CHLORIDE: 102 mmol/L (ref 98–107)
CO2: 26.8 mmol/L (ref 20.0–31.0)
CREATININE: 0.64 mg/dL
EGFR CKD-EPI (2021) FEMALE: >90 mL/min/{1.73_m2} (ref >=60)
GLUCOSE RANDOM: 145 mg/dL (ref 70–179)
POTASSIUM: 4.1 mmol/L (ref 3.4–4.8)
PROTEIN TOTAL: 7 g/dL (ref 5.7–8.2)
SODIUM: 134 mmol/L — ABNORMAL LOW (ref 135–145)

## 2021-11-09 LAB — CANCER ANTIGEN 27.29: CA 27-29: 84.2 U/mL — ABNORMAL HIGH (ref <=38.6)

## 2021-11-09 MED ORDER — METFORMIN 500 MG TABLET
ORAL_TABLET | Freq: Two times a day (BID) | ORAL | 3 refills | 90 days | Status: CP
Start: 2021-11-09 — End: 2022-11-09

## 2021-11-09 MED ADMIN — fulvestrant (FASLODEX) injection 500 mg: 500 mg | INTRAMUSCULAR | @ 13:00:00 | Stop: 2021-11-09

## 2021-11-09 NOTE — Unmapped (Signed)
Return Visit Note    Patient Name: Karen Hays  Patient Age: 71 y.o.  Encounter Date: 11/09/2021    Referring Physician:   Marlowe Kays, Md  [address]  [place]  [place],  [place] [address].     PCP:   Cassell Smiles, MD    Cancer Team  Surgical Oncology: Lucretia Roers, MD  Medical Oncology: Adonis Brook, MD      Reason for visit  Breast breast cancer patient here for reassessment and further management; she was previously a Dr. Claude Manges patient and now under the care of Dr. Laney Pastor.    Diagnosis:   1. 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     Last staging: March 2023 showing multiple foci of new uptake within the axial and appendicular skeletons, in addition to the extension of previously seen large left proximal femur lesion consistent with progression of disease.     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   At this time we recommend Apelisib and Falsodex; she will start Falsodex today (C1D1), D15 (5/19), D28 (6/1) the every 28 days. Apelisib has been prescribed, awaiting reception.  She will need weekly labs for 2 weeks then once every 4 weeks then as clinically indicated after starting treatment to monitor fasting glucose.          Bone Directed therapy : Xgeva every 8 weeks last given 09/28/21. Due again 11/21/21    2. Primary Lung cancer  -Patient completed right upper lobe lobectomy with lymph node biopsy on 02/18/2020 showing a 2.5 cm tumor, pT1cN0  -She has recovered well and following with Dr. Allayne Gitelman  -CT chest monitoring per Dr. Oneida Alar; she has a follow up later this month.       3.Smoking addiction  -She has been trying to quit and has tried multiple efforts so far that have failed her.  -declined referral to smoking cessation clinic again today.    Other medical problems include:  -Bipolar disorder  -HTN  -Hx of kindney stones    Supportive care:   Cancer related pain; left hip and right shoulder. She is currently taking oxycodone prn and reports this has been effective.Continue to monitor in follow up      DISPO:  -Return in 2 weeks for labs and Falsodex, xgeva  -Oral chemo education visit with Aimee Faso   --Alpelisib prescribed; plan to recheck labs 1 week after starting   --Follow up in 4 weeks with MD or NP, labs and Falsodex      Interim History:  Karen Hays is a 71 y.o. female who was a previous Dr. Claude Manges patient transferred care to after his Dr. Laney Pastor after his retirement.  Patient returns for follow-up.    Patient is doing well in general, she continues to report pain in her hips  and shoulders. She is currently taking oxycodone and report this helps. She denies any new or unusual pain. She does report fatigue that has been ongoing and unchanged. She reports her appetite has been good, she denies wt loss.     Karen Hays also has a hx of lung cancer; she is s/p resection in August 2021 and was lost to follow-up after that.  Patient reported that she has lost a family member who was like a son to her and this pushed her into severe depression where she stayed at home and would not leave the house.  She followed up in  Feb 2022. She denies depression today.        Breast Cancer History  Oncology History   Breast cancer (CMS-HCC)   01/28/2002 Surgery    Right breast lumpectomy: Intraductal carcinoma in situ with extensive intraductal hyperplasia and fibrocystic disease.     03/2002 - 04/2002 Radiation    External beam radiation to the right breast     06/11/2018 Initial Diagnosis    Breast cancer (CMS-HCC): Mammogram: Solid heterogeneous hypervascular mass in the right breast,3.3 x 2.9 x 2.6 cm  cm.  Left breast unremarkable.     06/26/2018 Biopsy     right breast biopsy: Intermediate grade infiltrating ductal carcinoma, Nottingham 2 (histologic score 3, nuclear score 2, mitotic count 1); ER 95%, PR negative, HER-2 negative, Ki-67 40%  Oncotype Dx 35 (> 15% chemotherapy benefit)     08/26/2018 Biopsy    Left breast Bx: 11:00, 2 cm from nipple, core needle biopsy  -Benign breast tissue with stromal fibrosis  ??     09/08/2018 -  Cancer Staged    CT Chest:  1. Right breast mass consistent with patient's known breast cancer.  2. Right apical masslike pulmonary parenchymal opacities, concerning for adenocarcinoma.  Lung RADS: 4B  3. Bilateral upper lobe predominant groundglass nodular pulmonary parenchymal opacities, indeterminate. Favor infectious/inflammatory (given upper lobe predominance) but can't rule out metastases completely.attention on follow-up.     09/16/2018 -  Cancer Staged    PET CT:  FDG avid spiculated right apical pulmonary mass, which may represent primary lung malignancy versus lung metastasis in the setting of breast cancer. Multiple bilateral subcentimeter pulmonary nodules which demonstrate FDG avidity as above, most compatible with pulmonary metastases.  - Large right FDG avid breast mass, compatible with known diagnosis of breast cancer.  - Increased radiotracer uptake in the left proximal femur, likely osseous metastatic disease.  - Right-sided laryngocele with internal debris.  - Hepatic steatosis.     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   Right axilla, sentinel lymph node, biopsy   - Three lymph nodes negative for carcinoma (0/3)     09/28/2018 -  Cancer Staged    Staging form: Breast, AJCC 8th Edition  - Pathologic stage from 09/28/2018: Stage IV (pT2, pN0(sn), cM1, G3, ER+, PR-, HER2-) - Signed by Jeanie Cooks, MD on 02/28/2021       10/08/2018 -  Cancer Staged    MRI LE:  --2.1 x 1.3 x 2.8 cm cortically based lesion in the left femur lesser trochanter and adjacent subtrochanteric shaft which is concerning for metastatic disease.  ??  --Small linear T1 hypointense, T2 hyperintense, enhancing focus  in the lesser trochanter of the right femur which is nonspecific.     10/09/2018 Biopsy    Bone, femur, left proximal, core biopsy  - Metastatic carcinoma, consistent with patient's known breast carcinoma       10/19/2018 -  Chemotherapy    Letrozole 2.5 mg daily, and palbociclib 125 mg daily 21/28 days     12/28/2018 -  Cancer Staged    CT Chest:  2.6 cm right apical lesion unchanged compared to 09/08/2018; leading diagnostic consideration is for primary lung neoplasm.     Multiple additional pulmonary nodules nodules unchanged compared to 09/08/2018; indeterminate, but favor lung adenocarcinoma spectrum rather than breast cancer metastasis.       08/02/2019 -  Cancer Staged    CT CAP:  Right apical 2.6 cm lesion stable compared to 12/28/2018 and remains concerning for a primary lung neoplasm.  Multiple bilateral nodular opacities are stable compared to prior  NED in AP.         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 Bipolar affective disorder (CMS-HCC)    ??? Breast cancer (CMS-HCC)     right   ??? GERD (gastroesophageal reflux disease)    ??? Hyperlipidemia    ??? Hypertension    ??? Kidney stones        PSH:  Past Surgical History:   Procedure Laterality Date   ??? BREAST BIOPSY     ??? BREAST LUMPECTOMY     ??? PR BX/REMV,LYMPH NODE,DEEP AXILL Right 09/18/2018    Procedure: BX/EXC LYMPH NODE; OPEN, DEEP AXILRY NODE;  Surgeon: Aris Everts, MD;  Location: ASC OR [facility];  Service: Surgical Oncology Breast   ??? PR INTRAOPERATIVE SENTINEL LYMPH NODE ID W DYE INJECTION Right 09/18/2018    Procedure: INTRAOPERATIVE IDENTIFICATION SENTINEL LYMPH NODE(S) INCLUDE INJECTION NON-RADIOACTIVE DYE, WHEN PERFORMED;  Surgeon: Aris Everts, MD;  Location: ASC OR [facility];  Service: Surgical Oncology Breast   ??? PR MASTECTOMY, SIMPLE, COMPLETE Right 09/18/2018    Procedure: MASTECTOMY, SIMPLE, COMPLETE;  Surgeon: Aris Everts, MD;  Location: ASC OR [facility];  Service: Surgical Oncology Breast   ??? PR THORACOSCOPY SURG LOBECTOMY Right 02/18/2020    Procedure: THORACOSCOPY, SURGICAL; WITH LOBECTOMY (SINGLE LOBE);  Surgeon: Evert Kohl, MD;  Location: MAIN OR [facility];  Service: Thoracic   ??? RADIATION         Allergies:  Chantix [varenicline]    Medications:  has a current medication list which includes the following prescription(s): alpelisib, alprazolam, cholecalciferol (vitamin d3-25 mcg (1,000 unit)), furosemide, lamotrigine, metoprolol  succinate, nicotine, oxycodone, pantoprazole, and simvastatin, and the following Facility-Administered Medications: denosumab.    Personal and Social History:  Social History Social History Narrative   ??? Not on file       Review of Systems: A complete review of systems was obtained including: Constitutional, Eyes, ENT, Cardiovascular, Respiratory, GI, GU, Musculoskeletal, Skin, Neurological, Psychiatric, Endocrine, Heme/Lymphatic, and Allergic/Immunologic systems. It is negative or non-contributory to the patient???s management except for what is detailed in the Interim History.    This was a tele health appointment so exam was limited.   General: Well appearing with no acute distress visible via video teleconferencing  Neuro: Alert and oriented x 4.  Fluent speech, no dysphasia  Respiratory: Normal Respiratory effort. Able to speak in full sentences without difficulty  Psych: Appropriate mood and affect. Thought content linear and appropriate    PRIOR Physical Exam:  GENERAL: Well appearing female in NAD.  VITAL SIGNS: BP 113/60  - Pulse 69  - Temp 36.5 ??C (97.7 ??F) (Temporal)  - Resp 18  - Wt 65.4 kg (144 lb 1.6 oz)  - SpO2 95%  - BMI 20.10 kg/m??   BMI: Body mass index is 20.1 kg/m??.  HEENT: Normocephalic, symmetric. EOMI. Sclera clear. Oropharynx benign.  LYMPH:No cervical, No supraclavicular, No axillary lymphadenopathy.  CHEST/BREASTS: (Deffered today ) Well healed mastectomy incision breast absent, No evidence of locoregional recurrence. Contralateral breast without mass, skin change, retraction or nipple discharge.    LUNGS: respirations even and unlabored.clear to ausculation bilaterally.  CARDIAC: RRR, no murmurs.   ABDOMEN: Soft, nontender, no hepatomegaly or masses  SKIN & SUBCUTANEOUS TISSUES: No/Yes rash, ecchymoses, purpuric lesions noted.  EXTREMITIES: No/Yes upper or lower extremity edema, normal skin tone. No ymphedema noted.  MSK: Spine is nontender.  NEURO: Alert, oriented, normal gait &coordination.    Orders/Results:  Appointment on 11/09/2021   Component Date Value   ??? Sodium 11/09/2021 134 (L)    ??? Potassium 11/09/2021 4.1    ??? Chloride 11/09/2021 102    ??? CO2 11/09/2021 26.8    ??? Anion Gap 11/09/2021 5    ??? BUN 11/09/2021 8 (L)    ??? Creatinine 11/09/2021 0.64    ??? BUN/Creatinine Ratio 11/09/2021 13    ??? eGFR CKD-EPI (2021) Fema* 11/09/2021 >90    ??? Glucose 11/09/2021 145    ??? Calcium 11/09/2021 9.8    ??? Albumin 11/09/2021 3.7    ??? Total Protein 11/09/2021 7.0    ??? Total Bilirubin 11/09/2021 0.4    ??? AST 11/09/2021 68 (H)    ??? ALT 11/09/2021 46    ??? Alkaline Phosphatase 11/09/2021 172 (H)    ??? CA 27-29 11/09/2021 84.2 (H)    ??? WBC 11/09/2021 6.4    ??? RBC 11/09/2021 3.65 (L)    ??? HGB 11/09/2021 13.1    ??? HCT 11/09/2021 38.6    ??? MCV 11/09/2021 105.9 (H)    ??? [facility] 11/09/2021 36.0 (H)    ??? MCHC 11/09/2021 34.0    ??? RDW 11/09/2021 14.1    ??? MPV 11/09/2021 7.7    ??? Platelet 11/09/2021 282    ??? nRBC 11/09/2021 0    ??? Neutrophils % 11/09/2021 69.0    ??? Lymphocytes % 11/09/2021 19.3    ??? Monocytes % 11/09/2021 8.9    ??? Eosinophils % 11/09/2021 1.3    ??? Basophils % 11/09/2021 1.5    ??? Absolute Neutrophils 11/09/2021 4.5    ??? Absolute Lymphocytes 11/09/2021 1.2    ???  Absolute Monocytes 11/09/2021 0.6    ??? Absolute Eosinophils 11/09/2021 0.1    ??? Absolute Basophils 11/09/2021 0.1      Results for orders placed or performed in visit on 11/09/21   Comprehensive Metabolic Panel   Result Value Ref Range    Sodium 134 (L) 135 - 145 mmol/L    Potassium 4.1 3.4 - 4.8 mmol/L    Chloride 102 98 - 107 mmol/L    CO2 26.8 20.0 - 31.0 mmol/L    Anion Gap 5 5 - 14 mmol/L    BUN 8 (L) 9 - 23 mg/dL    Creatinine 1.61 0.96 - 0.80 mg/dL    BUN/Creatinine Ratio 13     eGFR CKD-EPI (2021) Female >90 >=60 mL/min/1.21m2    Glucose 145 70 - 179 mg/dL    Calcium 9.8 8.7 - 04.5 mg/dL    Albumin 3.7 3.4 - 5.0 g/dL    Total Protein 7.0 5.7 - 8.2 g/dL    Total Bilirubin 0.4 0.3 - 1.2 mg/dL    AST 68 (H) <=40 U/L    ALT 46 10 - 49 U/L    Alkaline Phosphatase 172 (H) 46 - 116 U/L   Cancer Antigen 27.29   Result Value Ref Range    CA 27-29 84.2 (H) <=38.6 U/mL   CBC w/ Differential   Result Value Ref Range    WBC 6.4 3.6 - 11.2 10*9/L    RBC 3.65 (L) 3.95 - 5.13 10*12/L    HGB 13.1 11.3 - 14.9 g/dL    HCT 98.1 19.1 - 47.8 %    MCV 105.9 (H) 77.6 - 95.7 fL    MCH 36.0 (H) 25.9 - 32.4 pg    MCHC 34.0 32.0 - 36.0 g/dL    RDW 29.5 62.1 - 30.8 %    MPV 7.7 6.8 - 10.7 fL    Platelet 282 150 - 450 10*9/L    nRBC 0 <=4 /100 WBCs    Neutrophils % 69.0 %    Lymphocytes % 19.3 %    Monocytes % 8.9 %    Eosinophils % 1.3 %    Basophils % 1.5 %    Absolute Neutrophils 4.5 1.8 - 7.8 10*9/L    Absolute Lymphocytes 1.2 1.1 - 3.6 10*9/L    Absolute Monocytes 0.6 0.3 - 0.8 10*9/L    Absolute Eosinophils 0.1 0.0 - 0.5 10*9/L    Absolute Basophils 0.1 0.0 - 0.1 10*9/L

## 2021-11-09 NOTE — Unmapped (Signed)
Right and left Gluteal area was cleansed with alcohol and allowed to dry, then RN administered Fulvestrant injection one to each side without complications while in the clinic area covered with ban-aide.

## 2021-11-12 NOTE — Unmapped (Signed)
Pharmacist Medication Follow-Up  I called the patient to f/u on initiation of alpelisib.  I left a VM with my contact information.

## 2021-11-13 NOTE — Unmapped (Signed)
[facility]   Patient Onboarding/Medication Counseling    Karen Hays is a 71 y.o. female with breast cancer who I am counseling today on initiation of therapy.  I am speaking to the patient.    Was a Nurse, learning disability used for this call? No    Verified patient's date of birth / HIPAA.    Specialty medication(s) to be sent: Hematology/Oncology: Piqray 300mg       Non-specialty medications/supplies to be sent: n/a      Medications not needed at this time: n/a         Piqray (Alpelisib)    Medication & Administration     Dosage: 300mg  (2 tablets) by mouth once daily (about the same time every day)      Administration: This is an oral medication and is taken with food.  Swallow the tablet whole and do not break, crush, or chew the tablet.     Adherence/Missed dose instructions: If you miss a dose, you can still take that dose with food within 9 hours of your usually scheduled dose.  If it is more than 9 hours skip that dose and take your next dose as schedule. Do not take 2 doses at the same time to make up for a missed dose. If you vomit after taking your dose, do not take another dose and take next dose at its normally scheduled time.    Comes in blister packs      Goals of Therapy     Prevent disease progression    Side Effects & Monitoring Parameters   Hyperglycemia  Nausea  Vomiting  Diarrhea  Fatigue  Rash (scaly or red itchy bumps)  Mouth sores  Decreased appetite/ taste changes  Infection precautions   Hair loss/thinning  Hepatotoxicity  Kidney toxicity (creatinine clearance)    The following side effects should be reported to the provider:      Signs of hyperglycemia (increased thirst, increase urination or hunger, blurry vision, headaches or breath that smells like fruit)  Unexplained cough, shortness of breath, trouble breathing (pneumonitis)  Severe skin reaction (unexplained rash, blistering, peeling of skin)  Mouth irritation or mouth sores  Diarrhea not controlled by anti-diarrheal Heartbeat that doesn't feel normal (heart feels like it's racing, skipping a beat or fluttering)  Decrease in urination, change in color of urine, blood in urine   Yellowing of skin or eyes, your urine appears dark or brown or pain in abdomen  Signs of allergic reaction (rash, hives, shortness of breath)  Severe skin toxicities         Contraindications, Warnings, & Precautions   Mouth sores- discussed use of baking soda/salt water rinses   May be started on cetirizine to reduce the risk of rash  Discussed importance of hydration if having frequent diarrhea  Exposure of an unborn child to this medication could cause birth defects so you should not become pregnant or father a child while on this medication.  Effective birth control should be used during treatment and for 1 week following the completion of therapy.      Drug/Food Interactions     Medication list reviewed in Epic. The patient was instructed to inform the care team before taking any new medications or supplements including over the counter medications, vitamins, and herbal supplements. No drug interactions identified.     Storage, Handling Precautions, & Disposal   This medication should be stored at room temperature and in a dry location. Keep out of reach of others  including children and pets. Keep the medicine in the original container with a child-proof top (no pillboxes). Do not throw away or flush unused medication down the toilet or sink. This drug is considered hazardous and should be handled as little as possible.  If someone else helps with medication administration, they should wear gloves      Current Medications (including OTC/herbals), Comorbidities and Allergies     Current Outpatient Medications   Medication Sig Dispense Refill    alpelisib 300 mg/day (150 mg x 2) Tab Take 2 tablets (300 mg) by mouth in the morning. 56 tablet 3    ALPRAZolam (XANAX) 0.5 MG tablet Take 1 tablet (0.5 mg total) by mouth.      cholecalciferol, vitamin D3, 25 mcg (1,000 unit) capsule Take 1 capsule (25 mcg total) by mouth.      furosemide (LASIX) 20 MG tablet Take 1 tablet (20 mg total) by mouth two (2) times a day as needed.      lamoTRIgine (LAMICTAL) 100 MG tablet       metFORMIN (GLUCOPHAGE) 500 MG tablet Take 1 tablet (500 mg total) by mouth in the morning and 1 tablet (500 mg total) in the evening. Take with meals. Start one week prior to starting chemo. 180 tablet 3    metoprolol succinate (TOPROL-XL) 25 MG 24 hr tablet       nicotine (NICODERM CQ) 14 mg/24 hr patch Place 1 patch on the skin daily. 28 patch 0    oxyCODONE (ROXICODONE) 10 mg immediate release tablet TAKE 1 TABLET BY MOUTH EVERY 4 HOURS      pantoprazole (PROTONIX) 40 MG tablet       simvastatin (ZOCOR) 40 MG tablet        No current facility-administered medications for this visit.     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    Appropriateness of Therapy     Acute infections noted within Epic:  No active infections  Patient reported infection: None    Is medication and dose appropriate based on diagnosis and infection status? Yes    Prescription has been clinically reviewed: Yes      Baseline Quality of Life Assessment      How many days over the past month did your condition  keep you from your normal activities? For example, brushing your teeth or getting up in the morning. 0    Financial Information     Medication Assistance provided: Prior Authorization    Anticipated copay of $3.61 reviewed with patient. Verified delivery address.    Delivery Information     Scheduled delivery date: 11/16/21    Expected start date: 11/24/21    Medication will be delivered via UPS to the prescription address in [facility].  This shipment will not require a signature.      Explained the services we provide at [facility] and that each month we would call to set up refills.  Stressed importance of returning phone calls so that we could ensure they receive their medications in time each month.  Informed patient that we should be setting up refills 7-10 days prior to when they will run out of medication.  A pharmacist will reach out to perform a clinical assessment periodically.  Informed patient that a welcome packet, containing information about our pharmacy and other support services, a Notice of Privacy Practices, and a drug information handout will be sent.      The patient or caregiver noted above participated in the development of this care plan and knows that they can request review of or adjustments to the care plan at any time.      Patient or caregiver verbalized understanding of the above information as well as how to contact the pharmacy at [phone] option 4 with any questions/concerns.  The pharmacy is open Monday through Friday 8:30am-4:30pm.  A pharmacist is available 24/7 via pager to answer any clinical questions they may have.    Patient Specific Needs     Does the patient have any physical, cognitive, or cultural barriers? No    Does the patient have adequate living arrangements? (i.e. the ability to store and take their medication appropriately) Yes    Did you identify any home environmental safety or security hazards? No    Patient prefers to have medications discussed with  Patient     Is the patient or caregiver able to read and understand education materials at a high school level or above? Yes    Patient's primary language is  English     Is the patient high risk? Yes, patient is taking oral chemotherapy. Appropriateness of therapy as been assessed        Rollen Sox  [facility] Specialty Pharmacist

## 2021-11-13 NOTE — Unmapped (Signed)
Pharmacist Oral Chemotherapy Education    Medications: Alpelisib    Medication Education     Karen Hays is a 71 y.o. female with PIK3CA mutation positive breast cancer who I am counseling today on alpelisib.    Alpelisib (Piqray)  Oral chemotherapy regimen: Alpelisib 300 mg po daily with food  Tentative Start Date: 11/24/21  Specialty Pharmacy: [facility]     Medication indication, administration daily with food, and oral chemotherapy safety handling were reviewed with the patient.  Side effects of alpelisib discussed included but were not limited to: hyperglycemia, nausea/vomiting, mucositis, diarrhea, hair thinning, rash, hypersensitivity reactions, fatigue, and pneumonitis.  Management of selected adverse effects were discussed as below:    Hyperglycemia  1. Take medication in the morning with a low carb breakfast.  A diabetic diet with low carbohydrates is recommended.  2. Start metformin for hyperglycemia prophylaxis per the [facility] trial.  Take 500 mg BID with meals x 3 days then increase to 1000 mg BID.  3. Will check FBG on day 7 after initiating alpelisib.  Patient is not interested in a glucometer for home testing.    Rash  For prophylaxis of rash, start an anti-histamine, such as cetrizine, daily.    Diarrhea  Initiate Imodium as needed for loose stools    Mucositis  Use a salt water mouth rinse BID to reduce the risk of mucositis     Drug medication plan:  11/16/21:  Start metformin  11/24/21: Start alpelisib and cetirizine  12/01/26: FBG   12/07/21: Provider appointment, Fulvestrant and FBG    Drug interactions: None identified    Handouts provided:  Patient drug information handouts from [facility]    Patient verbalized understanding of the above information as well as how to contact the Team with any questions/concerns.    Approximate time spent on the phone with patient: 25 minutes     Selah Zelman Oleh Genin PharmD, BCOP, CPP  Hematology/Oncology Pharmacist  P: [phone]

## 2021-11-23 ENCOUNTER — Institutional Professional Consult (permissible substitution): Admit: 2021-11-23 | Discharge: 2021-11-23 | Payer: MEDICARE

## 2021-11-23 ENCOUNTER — Ambulatory Visit: Admit: 2021-11-23 | Discharge: 2021-11-23 | Payer: MEDICARE

## 2021-11-23 DIAGNOSIS — C3491 Malignant neoplasm of unspecified part of right bronchus or lung: Principal | ICD-10-CM

## 2021-11-23 DIAGNOSIS — C7951 Secondary malignant neoplasm of bone: Principal | ICD-10-CM

## 2021-11-23 DIAGNOSIS — R739 Hyperglycemia, unspecified: Principal | ICD-10-CM

## 2021-11-23 DIAGNOSIS — C50911 Malignant neoplasm of unspecified site of right female breast: Principal | ICD-10-CM

## 2021-11-23 DIAGNOSIS — Z170 Estrogen receptor positive status [ER+]: Principal | ICD-10-CM

## 2021-11-23 LAB — CREATININE
CREATININE: 0.65 mg/dL
EGFR CKD-EPI (2021) FEMALE: >90 mL/min/{1.73_m2} (ref >=60)

## 2021-11-23 LAB — ALBUMIN: ALBUMIN: 3.4 g/dL (ref 3.4–5.0)

## 2021-11-23 LAB — PHOSPHORUS: PHOSPHORUS: 2.4 mg/dL (ref 2.4–5.1)

## 2021-11-23 LAB — CALCIUM: CALCIUM: 9.2 mg/dL (ref 8.7–10.4)

## 2021-11-23 MED ADMIN — fulvestrant (FASLODEX) injection 500 mg: 500 mg | INTRAMUSCULAR | @ 16:00:00 | Stop: 2021-11-23

## 2021-11-23 MED ADMIN — denosumab (XGEVA) injection 120 mg: 120 mg | SUBCUTANEOUS | @ 16:00:00 | Stop: 2021-11-23

## 2021-11-23 NOTE — Unmapped (Signed)
I spoke to Karen Hays re: plan of care and labs needed next week. She plans to start her Apelisib tomorrow and will need a FBG on day 7 (5/26). I offered to put these in for her to have done at [facility]. The closest [employer] to her house is [place]. SHe said if that is not a possibility, she does not ind driving the 1 hour to [facility] to get the lab drawn. Will enter orders for both and let her decide which location she prefers.

## 2021-11-23 NOTE — Unmapped (Signed)
Pt here for scheduled Xgeva and Fulvestrant injections.  Lab parameters met for Xgeva- injection given subcutaneous to left lower abd- pt tolerated well.  Fulvestrant injections given 250mg  IM to each gluteal  as ordered- pt tolerated both injections well.  Pt reported leg/hip pain that has been an ongoing issue for her- instructed pt to reach out to her provider via My Chart- she verbalized understanding and reports she will do so if it get worse.  She does see Kerrie Pleasure on 12/07/21 and pt verbalized that she will talk with provider about the issue at that time if not sooner.  Discharged ambulatory off unit to home to self care.

## 2021-11-26 DIAGNOSIS — C7951 Secondary malignant neoplasm of bone: Principal | ICD-10-CM

## 2021-11-27 NOTE — Unmapped (Signed)
Pharmacist Medication Follow-Up  I called the patient to f/u on alpelisib.  I left a VM with my contact information.

## 2021-11-27 NOTE — Unmapped (Signed)
Called and spoke with patient and she stated she was not feeling well and had to cancel her appointment, we rescheduled her and set her up for a video visit with the provider.     5.23.23 @ 9:01am  Colin Mulders

## 2021-11-30 LAB — GLUCOSE, FASTING: GLUCOSE: 126 mg/dL — ABNORMAL HIGH (ref 70–99)

## 2021-11-30 NOTE — Unmapped (Signed)
Pharmacist Phone Follow-Up    Cancer Team  Medical Oncology: Dr. Laney Pastor  Reason for call: Oral chemotherapy management  Current treatment: Alpelisib + fulvestrant + denosumab     Assessment/Plan  Metastatic breast cancer:  Karen Hays is a 71 y.o. female with metastatic breast cancer and a PIK3CA mutation.  She started fulvestrant on 11/09/21 and started alpelisib on 11/24/21.  She is also currently on metformin 1000 mg BID.  She has diarrhea, due to either metformin and/or alpelsib, and is managing this adverse effect with loperamide.      C1D7 FBG= 126.  Grade 1 hyperglycemia.  Recommend to continue alpelisib and metformin with no modifications.     Plan:  Continue alpelisib 300 mg qam with low carb breakfast  Continue metformin 1000 mg BID with meals  Continue antihistamine for rash prophylaxis  12/07/21:  C1 D14 labs including fasting blood glucose and provider visit.  If FBG continues to be < 140, glucose may then be checked monthly  ________________________________________________________________________     Interval History   [name] is a 71 y.o. female with metastatic breast cancer who I am calling after starting alpelisib approximately one week ago.  She notes that she started metformin one week prior to alpelisib and is now taking metformin 1000 mg BID.  She is having intermittent diarrhea with episodes ranging from 0 to 3 daily.  She has [facility] loperamide at home (patient read me the name from her package) and I reviewed how to take this medication to treat diarrhea:  2 tablets after the 1st or 2nd loose stool and then 1 tab after each subsequent loose stool; up to 8 tabs per day.  I informed her that she should let us know if her diarrhea is not controlled after taking 3 doses of loperamide in a 24 hour period.    She reports no other adverse effects.  She is concerned about making proper diet choices.  Dietician referral has been requested.    I let Ms. Beckett know that her labs in one week will require her to fast.    Oral chemotherapy:  Alpelisib 300 mg daily with food  Start date: 11/24/21  Pharmacy: [facility]     Adherence: No missed doses    Adverse Effects:   Diarrhea - may be due to metformin and/or alpelisib; controlled with loperamide    Drug interactions: None identified    Medication list reviewed and updated in Epic.    Patient verbalized understanding of the above information.     I spent 15 minutes on the phone with the patient on the date of service. I spent an additional 15 minutes on pre- and post-visit activities.     The patient was physically located in [place] or a state in which I am permitted to provide care. The patient and/or parent/guardian understood that s/he may incur co-pays and cost sharing, and agreed to the telemedicine visit. The visit was reasonable and appropriate under the circumstances given the patient's presentation at the time.    The patient and/or parent/guardian has been advised of the potential risks and limitations of this mode of treatment (including, but not limited to, the absence of in-person examination) and has agreed to be treated using telemedicine. The patient's/patient's family's questions regarding telemedicine have been answered.     If the visit was completed in an ambulatory setting, the patient and/or parent/guardian has also been advised to contact their provider???s office for worsening conditions, and seek emergency medical  treatment and/or call 911 if the patient deems either necessary.        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       03/2002 - 04/2002 Radiation    External beam radiation to the right breast       06/11/2018 Initial Diagnosis    Breast cancer (CMS-HCC): Mammogram: Solid heterogeneous hypervascular mass in the right breast,3.3 x 2.9 x 2.6 cm  cm.  Left breast unremarkable.       06/26/2018 Biopsy     right breast biopsy: Intermediate grade infiltrating ductal carcinoma, Nottingham 2 (histologic score 3, nuclear score 2, mitotic count 1); ER 95%, PR negative, HER-2 negative, Ki-67 40%  Oncotype Dx 35 (> 15% chemotherapy benefit)       08/26/2018 Biopsy    Left breast Bx: 11:00, 2 cm from nipple, core needle biopsy  -Benign breast tissue with stromal fibrosis        09/08/2018 -  Cancer Staged    CT Chest:  1. Right breast mass consistent with patient's known breast cancer.  2. Right apical masslike pulmonary parenchymal opacities, concerning for adenocarcinoma.  Lung RADS: 4B  3. Bilateral upper lobe predominant groundglass nodular pulmonary parenchymal opacities, indeterminate. Favor infectious/inflammatory (given upper lobe predominance) but can't rule out metastases completely.attention on follow-up.     09/16/2018 -  Cancer Staged    PET CT:  FDG avid spiculated right apical pulmonary mass, which may represent primary lung malignancy versus lung metastasis in the setting of breast cancer. Multiple bilateral subcentimeter pulmonary nodules which demonstrate FDG avidity as above, most compatible with pulmonary metastases.  - Large right FDG avid breast mass, compatible with known diagnosis of breast cancer.  - Increased radiotracer uptake in the left proximal femur, likely osseous metastatic disease.  - Right-sided laryngocele with internal debris.  - Hepatic steatosis.     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     --Small linear T1 hypointense, T2 hyperintense, enhancing focus in the lesser trochanter of the right femur which is nonspecific.     10/09/2018 Biopsy    Bone, femur, left proximal, core biopsy  - Metastatic carcinoma, consistent with patient's known breast carcinoma         10/19/2018 -  Chemotherapy    Letrozole 2.5 mg daily, and palbociclib 125 mg daily 21/28 days       12/28/2018 -  Cancer Staged    CT Chest:  2.6 cm right apical lesion unchanged compared to 09/08/2018; leading  diagnostic consideration is for primary lung neoplasm.     Multiple additional pulmonary nodules nodules unchanged compared to 09/08/2018; indeterminate, but favor lung adenocarcinoma spectrum rather than breast cancer metastasis.       08/02/2019 -  Cancer Staged    CT CAP:  Right apical 2.6 cm lesion stable compared to 12/28/2018 and remains concerning for a primary lung neoplasm.  Multiple bilateral nodular opacities are stable compared to prior  NED in AP.         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   Latest known visit with results is:   Telephone on 11/23/2021   Component Date Value Ref Range Status    Glucose 11/29/2021 126 (H)  70 - 99 mg/dL Final       Current medications:     Current Outpatient Medications   Medication Sig Dispense Refill    alpelisib 300 mg/day (150 mg x 2) Tab Take 2 tablets (300 mg) by mouth daily. 56 tablet 3    ALPRAZolam (XANAX) 0.5 MG tablet Take 1 tablet (0.5 mg total) by mouth.      cholecalciferol, vitamin D3, 25 mcg (1,000 unit) capsule Take 1 capsule (25 mcg total) by mouth.      furosemide (LASIX) 20 MG tablet Take 1 tablet (20 mg total) by mouth two (2) times a day as needed.      lamoTRIgine (LAMICTAL) 100 MG tablet       metFORMIN (GLUCOPHAGE) 500 MG tablet Take 1 tablet (500 mg total) by mouth in the morning and 1 tablet (500 mg total) in the evening. Take with meals. Start one week prior to starting chemo. 180 tablet 3    metoprolol succinate (TOPROL-XL) 25 MG 24 hr tablet       nicotine (NICODERM CQ) 14 mg/24 hr patch Place 1 patch on the skin daily. 28 patch 0    oxyCODONE (ROXICODONE) 10 mg immediate release tablet TAKE 1 TABLET BY MOUTH EVERY 4 HOURS      pantoprazole (PROTONIX) 40 MG tablet       simvastatin (ZOCOR) 40 MG tablet        No current facility-administered medications for this visit.     Facility-Administered Medications Ordered in Other Visits   Medication Dose Route Frequency Provider Last Rate Last Admin    denosumab (XGEVA) injection 120 mg  120 mg Subcutaneous Once Jeanie Cooks, MD

## 2021-12-03 NOTE — Unmapped (Signed)
Return Visit Note    Patient Name: Karen Hays  Patient Age: 71 y.o.  Encounter Date: 12/07/2021    Referring Physician:   Lovett Calender, Fnp  [address]  [place],  [place] [address].     PCP:   No PCP Per Patient    Cancer Team  Surgical Oncology: Lucretia Roers, MD  Medical Oncology: Adonis Brook, MD      Reason for visit  Breast breast cancer patient here for reassessment and further management; she was previously a Dr. Claude Manges patient and now under the care of Dr. Laney Pastor.    Diagnosis:   1. 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     Last staging: March 2023 showing multiple foci of new uptake within the axial and appendicular skeletons, in addition to the extension of previously seen large left proximal femur lesion consistent with progression of disease.     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       She started fulvestrant on 11/09/21 and started alpelisib (300 mg every AM) on 11/24/21. She initially metformin as instructed 500 mg BID for 3 days followed by 1000 mg BID. However on 5/30         she dropped down to 500 mg BID on her own. She felt that the metformin was causing lost of appetite and diarrhea. She reports some improvement in symptoms since making this                   change. For now she will keep with the current dose of metformin and she will repeat FBG in 1 week.       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         Bone Directed therapy : Xgeva every 8 weeks last given 09/28/21. Due again 11/21/21    2. Primary Lung cancer  -Patient completed right upper lobe lobectomy with lymph node biopsy on 02/18/2020 showing a 2.5 cm tumor, pT1cN0  -She has recovered well and following with Dr. Allayne Gitelman  -CT chest monitoring per Dr. Oneida Alar; Last CT Chest March 2023 showed no evidence of maligancy      3.Smoking addiction  -She has been trying to quit and has tried multiple efforts so far that have failed her.  -declined referral to smoking cessation clinic again today.    Other medical problems include:  -Bipolar disorder  -HTN  -Hx of kindney stones    Supportive care:   Cancer related pain; left hip and right shoulder. She is currently taking oxycodone prn and reports this has been effective.Continue to monitor in follow up      DISPO:  -Labs reviewed; continue with Alpelisib  -Labs in 1 week patient request to be done with [facility]  -Follow up in 4 weeks with APP, labs and fulvestrant  -CT C/A/P and bone  scan in 8 weeks to be completed prior to MD appt.   -Follow up with Dr. Laney Pastor in 8 weeks, labs, fulvestrant and scan results       Interim History:  Karen Hays is a 71 y.o. female who was a previous Dr. Claude Manges patient transferred care to after his Dr. Laney Pastor after his retirement.  Patient returns for follow-up and ongoing management of metastatic breast cancer.l     She reports her appetite is overall decreased, reports weight loss which and she is very concern about. She is down 9lbs since last visit.  She also report intermittent diarrhea; she is taking imodium and this dose help. She denies any diarrhea today.   She denies fever, chills, no headache or dizziness.     She continues to report pain in her hips and shoulders. She is currently taking oxycodone and report this helps. She reports the pain is not as bad since starting treatment. She denies any new or unusual pain. She does report fatigue that has been ongoing and unchanged. She reports her appetite has been good, she denies wt loss.     Karen Hays also has a hx of lung cancer; she is s/p resection in August 2021 and was lost to follow-up after that.  Patient reported that she has lost a family member who was like a son to her and this pushed her into severe depression where she stayed at home and would not leave the house.  She followed up in  Feb 2022. She denies depression today.        Breast Cancer History  Oncology History   Breast cancer (CMS-HCC)   01/28/2002 Surgery    Right breast lumpectomy: Intraductal carcinoma in situ with extensive intraductal hyperplasia and fibrocystic disease.     03/2002 - 04/2002 Radiation    External beam radiation to the right breast     06/11/2018 Initial Diagnosis    Breast cancer (CMS-HCC): Mammogram: Solid heterogeneous hypervascular mass in the right breast,3.3 x 2.9 x 2.6 cm  cm.  Left breast unremarkable.     06/26/2018 Biopsy     right breast biopsy: Intermediate grade infiltrating ductal carcinoma, Nottingham 2 (histologic score 3, nuclear score 2, mitotic count 1); ER 95%, PR negative, HER-2 negative, Ki-67 40%  Oncotype Dx 35 (> 15% chemotherapy benefit)     08/26/2018 Biopsy    Left breast Bx: 11:00, 2 cm from nipple, core needle biopsy  -Benign breast tissue with stromal fibrosis  ??     09/08/2018 -  Cancer Staged    CT Chest:  1. Right breast mass consistent with patient's known breast cancer.  2. Right apical masslike pulmonary parenchymal opacities, concerning for adenocarcinoma.  Lung RADS: 4B  3. Bilateral upper lobe predominant groundglass nodular pulmonary parenchymal opacities, indeterminate. Favor infectious/inflammatory (given upper lobe predominance) but can't rule out metastases completely.attention on follow-up.     09/16/2018 -  Cancer Staged    PET CT:  FDG avid spiculated right apical pulmonary mass, which may represent primary lung malignancy versus lung metastasis in the setting of breast cancer. Multiple bilateral subcentimeter pulmonary nodules which demonstrate FDG avidity as above, most compatible with pulmonary metastases.  - Large right FDG avid breast mass, compatible with known diagnosis of breast cancer.  - Increased radiotracer uptake in the left proximal femur, likely osseous metastatic disease.  - Right-sided laryngocele with internal debris.  - Hepatic steatosis.     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   Right axilla, sentinel lymph node, biopsy   - Three lymph nodes negative for carcinoma (0/3)     09/28/2018 -  Cancer Staged    Staging form: Breast, AJCC 8th Edition  - Pathologic stage from 09/28/2018: Stage IV (pT2, pN0(sn), cM1, G3, ER+, PR-, HER2-) - Signed by Jeanie Cooks, MD on 02/28/2021       10/08/2018 -  Cancer Staged    MRI LE:  --2.1 x 1.3 x 2.8 cm cortically based lesion in the left femur lesser trochanter and adjacent subtrochanteric shaft which is concerning for metastatic disease.  ??  --Small linear T1 hypointense, T2 hyperintense, enhancing focus in the lesser trochanter of the right femur which is nonspecific.     10/09/2018 Biopsy    Bone, femur, left proximal, core biopsy  - Metastatic carcinoma, consistent with patient's known breast carcinoma       10/19/2018 -  Chemotherapy    Letrozole 2.5 mg daily, and palbociclib 125 mg daily 21/28 days     12/28/2018 -  Cancer Staged    CT Chest:  2.6 cm right apical lesion unchanged compared to 09/08/2018; leading diagnostic consideration is for primary lung neoplasm.     Multiple additional pulmonary nodules nodules unchanged compared to 09/08/2018; indeterminate, but favor lung adenocarcinoma spectrum rather than breast cancer metastasis.       08/02/2019 - Cancer Staged    CT CAP:  Right apical 2.6 cm lesion stable compared to 12/28/2018 and remains concerning for a primary lung neoplasm.  Multiple bilateral nodular opacities are stable compared to prior  NED in AP.         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 Bipolar affective disorder (CMS-HCC)    ??? Breast cancer (CMS-HCC)     right   ??? GERD (gastroesophageal reflux disease)    ??? Hyperlipidemia    ??? Hypertension    ??? Kidney stones        PSH:  Past Surgical History:   Procedure Laterality Date   ??? BREAST BIOPSY     ??? BREAST LUMPECTOMY     ??? PR BX/REMV,LYMPH NODE,DEEP AXILL Right 09/18/2018    Procedure: BX/EXC LYMPH NODE; OPEN, DEEP AXILRY NODE;  Surgeon: Aris Everts, MD;  Location: ASC OR [facility];  Service: Surgical Oncology Breast   ???  PR INTRAOPERATIVE SENTINEL LYMPH NODE ID W DYE INJECTION Right 09/18/2018    Procedure: INTRAOPERATIVE IDENTIFICATION SENTINEL LYMPH NODE(S) INCLUDE INJECTION NON-RADIOACTIVE DYE, WHEN PERFORMED;  Surgeon: Aris Everts, MD;  Location: [facility] OR [facility];  Service: Surgical Oncology Breast   ??? PR MASTECTOMY, SIMPLE, COMPLETE Right 09/18/2018    Procedure: MASTECTOMY, SIMPLE, COMPLETE;  Surgeon: Aris Everts, MD;  Location: [facility] OR [facility];  Service: Surgical Oncology Breast   ??? PR THORACOSCOPY SURG LOBECTOMY Right 02/18/2020    Procedure: THORACOSCOPY, SURGICAL; WITH LOBECTOMY (SINGLE LOBE);  Surgeon: Evert Kohl, MD;  Location: MAIN OR [facility];  Service: Thoracic   ??? RADIATION         Allergies:  Chantix [varenicline]    Medications:  has a current medication list which includes the following prescription(s): alpelisib, alprazolam, cholecalciferol (vitamin d3-25 mcg (1,000 unit)), furosemide, lamotrigine, metformin, metoprolol succinate, nicotine, oxycodone, pantoprazole, simvastatin, and prochlorperazine, and the following Facility-Administered Medications: denosumab.    Personal and Social History:  Social History     Social History Narrative   ??? Not on file       Review of Systems: A complete review of systems was obtained including: Constitutional, Eyes, ENT, Cardiovascular, Respiratory, GI, GU, Musculoskeletal, Skin, Neurological, Psychiatric, Endocrine, Heme/Lymphatic, and Allergic/Immunologic systems. It is negative or non-contributory to the patient???s management except for what is detailed in the Interim History.    This was a tele health appointment so exam was limited.   General: Well appearing with no acute distress visible via video teleconferencing  Neuro: Alert and oriented x 4.  Fluent speech, no dysphasia  Respiratory: Normal Respiratory effort. Able to speak in full sentences without difficulty  Psych: Appropriate mood and affect. Thought content linear and appropriate    PRIOR Physical Exam:  GENERAL: Well appearing female in NAD.  VITAL SIGNS: BP 127/60  - Pulse 64  - Temp 36.8 ??C (98.2 ??F) (Temporal)  - Resp 16  - Wt 61.4 kg (135 lb 4.8 oz)  - SpO2 97%  - BMI 18.87 kg/m??   BMI: Body mass index is 18.87 kg/m??.  HEENT: Normocephalic, symmetric. EOMI. Sclera clear. Oropharynx benign.  LYMPH:No cervical, No supraclavicular, No axillary lymphadenopathy.  CHEST/BREASTS: (Deffered today ) Well healed mastectomy incision breast absent, No evidence of locoregional recurrence. Contralateral breast without mass, skin change, retraction or nipple discharge.    LUNGS: respirations even and unlabored.clear to ausculation bilaterally.  CARDIAC: RRR, no murmurs.   ABDOMEN: Soft, nontender, no hepatomegaly or masses  SKIN & SUBCUTANEOUS TISSUES: No/Yes rash, ecchymoses, purpuric lesions noted.  EXTREMITIES: No/Yes upper or lower extremity edema, normal skin tone. No ymphedema noted.  MSK: Spine is nontender.  NEURO: Alert, oriented, normal gait &coordination.    Orders/Results:  Appointment on 12/07/2021   Component Date Value   ??? Sodium 12/07/2021 134 (L)    ??? Potassium 12/07/2021 4.3    ??? Chloride 12/07/2021 101    ??? CO2 12/07/2021 26.6    ??? Anion Gap 12/07/2021 6    ??? BUN 12/07/2021 9    ??? Creatinine 12/07/2021 0.75    ??? BUN/Creatinine Ratio 12/07/2021 12    ??? eGFR CKD-EPI (2021) Fema* 12/07/2021 85    ??? Glucose 12/07/2021 134    ??? Calcium 12/07/2021 9.8    ??? Albumin 12/07/2021 3.8    ??? Total Protein 12/07/2021 7.1    ??? Total Bilirubin 12/07/2021 0.4    ??? AST 12/07/2021 77 (H)    ??? ALT 12/07/2021 70 (H)    ???  Alkaline Phosphatase 12/07/2021 131 (H)    ??? WBC 12/07/2021 5.6    ??? RBC 12/07/2021 4.16    ??? HGB 12/07/2021 14.6    ??? HCT 12/07/2021 42.4    ??? MCV 12/07/2021 101.9 (H)    ??? [facility] 12/07/2021 35.0 (H)    ??? MCHC 12/07/2021 34.4    ??? RDW 12/07/2021 14.3    ??? MPV 12/07/2021 7.6    ??? Platelet 12/07/2021 217    ??? nRBC 12/07/2021 0    ??? Neutrophils % 12/07/2021 60.3    ??? Lymphocytes % 12/07/2021 19.8    ??? Monocytes % 12/07/2021 8.3    ??? Eosinophils % 12/07/2021 10.4    ??? Basophils % 12/07/2021 1.2    ??? Absolute Neutrophils 12/07/2021 3.4    ??? Absolute Lymphocytes 12/07/2021 1.1    ??? Absolute Monocytes 12/07/2021 0.5    ??? Absolute Eosinophils 12/07/2021 0.6 (H)    ??? Absolute Basophils 12/07/2021 0.1      Results for orders placed or performed in visit on 12/07/21   Comprehensive Metabolic Panel   Result Value Ref Range    Sodium 134 (L) 135 - 145 mmol/L    Potassium 4.3 3.4 - 4.8 mmol/L    Chloride 101 98 - 107 mmol/L    CO2 26.6 20.0 - 31.0 mmol/L    Anion Gap 6 5 - 14 mmol/L    BUN 9 9 - 23 mg/dL    Creatinine 1.61 0.96 - 0.80 mg/dL    BUN/Creatinine Ratio 12     eGFR CKD-EPI (2021) Female 85 >=60 mL/min/1.59m2    Glucose 134 70 - 179 mg/dL    Calcium 9.8 8.7 - 04.5 mg/dL    Albumin 3.8 3.4 - 5.0 g/dL    Total Protein 7.1 5.7 - 8.2 g/dL    Total Bilirubin 0.4 0.3 - 1.2 mg/dL    AST 77 (H) <=40 U/L    ALT 70 (H) 10 - 49 U/L    Alkaline Phosphatase 131 (H) 46 - 116 U/L   CBC w/ Differential   Result Value Ref Range    WBC 5.6 3.6 - 11.2 10*9/L    RBC 4.16 3.95 - 5.13 10*12/L    HGB 14.6 11.3 - 14.9 g/dL    HCT 98.1 19.1 - 47.8 %    MCV 101.9 (H) 77.6 - 95.7 fL    MCH 35.0 (H) 25.9 - 32.4 pg    MCHC 34.4 32.0 - 36.0 g/dL    RDW 29.5 62.1 - 30.8 %    MPV 7.6 6.8 - 10.7 fL    Platelet 217 150 - 450 10*9/L    nRBC 0 <=4 /100 WBCs    Neutrophils % 60.3 %    Lymphocytes % 19.8 %    Monocytes % 8.3 %    Eosinophils % 10.4 %    Basophils % 1.2 %    Absolute Neutrophils 3.4 1.8 - 7.8 10*9/L    Absolute Lymphocytes 1.1 1.1 - 3.6 10*9/L    Absolute Monocytes 0.5 0.3 - 0.8 10*9/L    Absolute Eosinophils 0.6 (H) 0.0 - 0.5 10*9/L    Absolute Basophils 0.1 0.0 - 0.1 10*9/L

## 2021-12-05 NOTE — Unmapped (Signed)
.  Raynelle Highland w/ [facility] contacted the Communication Center requesting to speak with the care team of Karen Hays to discuss:    She is calling stating that the patient stated that she was taking off the ibrance 125mg . She is wanting to know if that is true and if so why she was taken off.     Please contact her back  at [phone].         Thank you,   Noland Fordyce  [facility]   [phone]

## 2021-12-05 NOTE — Unmapped (Signed)
Called Aggie Cosier to let her know that the medication had been stopped due to progression of the pts disease.

## 2021-12-07 ENCOUNTER — Ambulatory Visit: Admit: 2021-12-07 | Discharge: 2021-12-07 | Payer: MEDICARE

## 2021-12-07 ENCOUNTER — Institutional Professional Consult (permissible substitution): Admit: 2021-12-07 | Discharge: 2021-12-07 | Payer: MEDICARE

## 2021-12-07 DIAGNOSIS — C50911 Malignant neoplasm of unspecified site of right female breast: Principal | ICD-10-CM

## 2021-12-07 DIAGNOSIS — C7951 Secondary malignant neoplasm of bone: Principal | ICD-10-CM

## 2021-12-07 DIAGNOSIS — C3491 Malignant neoplasm of unspecified part of right bronchus or lung: Principal | ICD-10-CM

## 2021-12-07 DIAGNOSIS — Z170 Estrogen receptor positive status [ER+]: Principal | ICD-10-CM

## 2021-12-07 LAB — CBC W/ AUTO DIFF
BASOPHILS ABSOLUTE COUNT: 0.1 10*9/L (ref 0.0–0.1)
BASOPHILS RELATIVE PERCENT: 1.2 %
EOSINOPHILS ABSOLUTE COUNT: 0.6 10*9/L — ABNORMAL HIGH (ref 0.0–0.5)
EOSINOPHILS RELATIVE PERCENT: 10.4 %
HEMATOCRIT: 42.4 % (ref 34.0–44.0)
HEMOGLOBIN: 14.6 g/dL (ref 11.3–14.9)
LYMPHOCYTES ABSOLUTE COUNT: 1.1 10*9/L (ref 1.1–3.6)
LYMPHOCYTES RELATIVE PERCENT: 19.8 %
MEAN CORPUSCULAR HEMOGLOBIN CONC: 34.4 g/dL (ref 32.0–36.0)
MEAN CORPUSCULAR HEMOGLOBIN: 35 pg — ABNORMAL HIGH (ref 25.9–32.4)
MEAN CORPUSCULAR VOLUME: 101.9 fL — ABNORMAL HIGH (ref 77.6–95.7)
MEAN PLATELET VOLUME: 7.6 fL (ref 6.8–10.7)
MONOCYTES ABSOLUTE COUNT: 0.5 10*9/L (ref 0.3–0.8)
MONOCYTES RELATIVE PERCENT: 8.3 %
NEUTROPHILS ABSOLUTE COUNT: 3.4 10*9/L (ref 1.8–7.8)
NEUTROPHILS RELATIVE PERCENT: 60.3 %
NUCLEATED RED BLOOD CELLS: 0 /100{WBCs} (ref <=4)
PLATELET COUNT: 217 10*9/L (ref 150–450)
RED BLOOD CELL COUNT: 4.16 10*12/L (ref 3.95–5.13)
RED CELL DISTRIBUTION WIDTH: 14.3 % (ref 12.2–15.2)
WBC ADJUSTED: 5.6 10*9/L (ref 3.6–11.2)

## 2021-12-07 LAB — COMPREHENSIVE METABOLIC PANEL
ALBUMIN: 3.8 g/dL (ref 3.4–5.0)
ALKALINE PHOSPHATASE: 131 U/L — ABNORMAL HIGH (ref 46–116)
ALT (SGPT): 70 U/L — ABNORMAL HIGH (ref 10–49)
ANION GAP: 6 mmol/L (ref 5–14)
AST (SGOT): 77 U/L — ABNORMAL HIGH (ref <=34)
BILIRUBIN TOTAL: 0.4 mg/dL (ref 0.3–1.2)
BLOOD UREA NITROGEN: 9 mg/dL (ref 9–23)
BUN / CREAT RATIO: 12
CALCIUM: 9.8 mg/dL (ref 8.7–10.4)
CHLORIDE: 101 mmol/L (ref 98–107)
CO2: 26.6 mmol/L (ref 20.0–31.0)
CREATININE: 0.75 mg/dL
EGFR CKD-EPI (2021) FEMALE: 85 mL/min/{1.73_m2} (ref >=60)
GLUCOSE RANDOM: 134 mg/dL (ref 70–179)
POTASSIUM: 4.3 mmol/L (ref 3.4–4.8)
PROTEIN TOTAL: 7.1 g/dL (ref 5.7–8.2)
SODIUM: 134 mmol/L — ABNORMAL LOW (ref 135–145)

## 2021-12-07 MED ORDER — PROCHLORPERAZINE MALEATE 10 MG TABLET
ORAL_TABLET | Freq: Four times a day (QID) | ORAL | 0 refills | 8 days | Status: CP | PRN
Start: 2021-12-07 — End: 2022-01-06

## 2021-12-07 MED ADMIN — fulvestrant (FASLODEX) injection 500 mg: 500 mg | INTRAMUSCULAR | @ 15:00:00 | Stop: 2021-12-07

## 2021-12-07 NOTE — Unmapped (Signed)
Pt states she had been taking her metformin 2 tablets in the morning and 2 tablets in the evening. She has now since stopped taking 2 tablets and is now taking 1 tablet both in the morning and evening. Pt states this will throw her behind on her prescription since she was taking the medication incorrectly. Please advise.

## 2021-12-10 NOTE — Unmapped (Signed)
[facility] Outpatient Nutrition Services   Medical Nutrition Therapy Consultation       Visit Type:    Initial Assessment    Referral Reason:   Starting Piqray, concerns for hyperglycemia; diet education for diabetic diet    Karen Hays is a 71 y.o. female seen for medical nutrition therapy for diabetic diet education in the setting of Piqray to help prevent hyperglycemia. Dx of breast cancer with mets to the bone. She has started [employer]. Taking metformin 2 tablets daily to help with blood sugar control. She reports appetite decrease since starting new medicines and current difficulty knowing what to eat/not eat at this time. No hx of DM noted.   Her medication list, allergies, notes from last several encounters and lab results were reviewed.     Past Medical History:   Diagnosis Date   ??? Bipolar affective disorder (CMS-HCC)    ??? Breast cancer (CMS-HCC)     right   ??? GERD (gastroesophageal reflux disease)    ??? Hyperlipidemia    ??? Hypertension    ??? Kidney stones      Anthropometrics   Height:   180.3 cm  Current Weight:   61.4 (kg) 12/07/21  BMI:18.87 kg/m2   Ideal Body Weight:70.5 (kg)   Percent Ideal Body Weight: 87%    Weight history:  Wt Readings from Last 6 Encounters:   12/07/21 61.4 kg (135 lb 4.8 oz)   11/09/21 65.4 kg (144 lb 1.6 oz)   09/28/21 67.4 kg (148 lb 11.2 oz)   06/15/21 71.8 kg (158 lb 4.8 oz)   02/28/21 69.4 kg (153 lb)   09/27/20 68.1 kg (150 lb 1.6 oz)     Nutrition Risk Screening:   Food Insecurity: Not on file      ASPEN/AND Malnutrition Screening:   Pt meets ASPEN/AND criteria for malnutrition as noted below:  Malnutrition Designation: Severe in context of chronic illness  -- Weight loss: >5% weight loss x 1 month (6% x 1 month)  -- Energy Intake: Intake <75% of estimated needs for 1 month  -- Muscle Mass: mild to moderate temporal wasting; mild to moderate interosseous wasting  -- Fat Mass: unable to fully assess  -- Fluid Accumulation: did not assess  -- Functional Assessment: did not assess    Biochemical Data, Medical Tests and Procedures:  All pertinent labs and imaging reviewed by Genevie Ann at 10:02 AM 12/10/2021.    Medications and Vitamin/Mineral Supplementation:   All nutritionally pertinent medications reviewed on 12/10/2021.   Nutritionally pertinent medications include: metformin, imodium   She denied taking any vitamin/mineral supplements at this time.    Current Outpatient Medications   Medication Sig Dispense Refill   ??? alpelisib 300 mg/day (150 mg x 2) Tab Take 2 tablets (300 mg) by mouth daily. 56 tablet 3   ??? ALPRAZolam (XANAX) 0.5 MG tablet Take 1 tablet (0.5 mg total) by mouth.     ??? cholecalciferol, vitamin D3, 25 mcg (1,000 unit) capsule Take 1 capsule (25 mcg total) by mouth.     ??? furosemide (LASIX) 20 MG tablet Take 1 tablet (20 mg total) by mouth two (2) times a day as needed.     ??? lamoTRIgine (LAMICTAL) 100 MG tablet      ??? metFORMIN (GLUCOPHAGE) 500 MG tablet Take 1 tablet (500 mg total) by mouth in the morning and 1 tablet (500 mg total) in the evening. Take with meals. Start one week prior to starting chemo. 180  tablet 3   ??? metoprolol succinate (TOPROL-XL) 25 MG 24 hr tablet      ??? nicotine (NICODERM CQ) 14 mg/24 hr patch Place 1 patch on the skin daily. 28 patch 0   ??? oxyCODONE (ROXICODONE) 10 mg immediate release tablet TAKE 1 TABLET BY MOUTH EVERY 4 HOURS     ??? pantoprazole (PROTONIX) 40 MG tablet      ??? prochlorperazine (COMPAZINE) 10 MG tablet Take 1 tablet (10 mg total) by mouth every six (6) hours as needed for nausea. 30 tablet 0   ??? simvastatin (ZOCOR) 40 MG tablet        No current facility-administered medications for this visit.     Facility-Administered Medications Ordered in Other Visits   Medication Dose Route Frequency Provider Last Rate Last Admin   ??? denosumab (XGEVA) injection 120 mg  120 mg Subcutaneous Once Jeanie Cooks, MD           Nutrition History:     Dietary Restrictions: No known food allergies or food intolerances.     Gastrointestinal Issues: Nausea Diarrhea- she feels diarrhea started post start of Piqray medication. She takes 2 imodium in the morning and 1 later in the day if needed on days she experiences diarrhea. She reports up to 4-5 loose stools in one day. Denied any diarrhea today or yesterday. Nausea triggered by the smell of certain foods. Denied any current medications to help with nausea but reports being prescribed one today by NP.    Oral Issues: she denied any difficulty chewing or swallowing    Hunger and Satiety: Reports loss of appetite after starting metformin    Food Safety and Access: She did not report issues.     Diet Recall:     Yesterday she ate 2 slices of wheat bread with peanut butter during the day    Beverages : water- 2-16oz bottles daily, regular Gatorade, 1 cup of coffee, occasional coke Zero but she feels it taste sweeter to her than regular coke    Supplements: Ensure, Pedialyte and Premier Protein shakes -usually drinks 1 shake per day in the morning when she takes medications. Prefers the Premier Protein shake to the other two. She mixes milk and crushed ice in with the Ensure and drinks 1/2 Ensure shake at a time to make it more palatable     Food-Related History:  Usual Food Choices: wheat bread, scrambled eggs, peanut butter, moist baked chicken, salad with cheese and catalina dressing; She feels she is craving salads and moist chicken. Avoiding processed meats as she reports being told to avoid these foods. Reports being unable to tolerate bananas. Does not feel cold or hot foods worsen or improve her nausea.      Nutrition Assessment       Severe malnutrition related to breast cancer with start of new medication and inadequate oral intake as evidenced by intake <75% for 1 month and >5% weight loss x 1 month.    Food and nutrition related knowledge deficit related to lack of previous nutrition related education as evidenced by unsure what to eat since start of new medication with goal to prevent hyperglycemia.     Daily Estimated Nutritional Needs: Weight of 61.4 (kg) used to calculate needs  Energy:   1850-2150kcals [  using  30-35kcal/kg  ]  Protein:   74-86gm [  using  1.2-1.4g/kg  ]  Fluid:   1850-2150 ml [ 87ml/kcal]    Nutrition Goals & Evaluation  Intake will aim to meet >75% of estimated nutrient needs  (New)  Aim for weight gain and/or maintenance  (New)  Aim for adequate nutrition related labs (glucose)  (New)    Nutrition goals reviewed, and relevant barriers identified and addressed: none evident. She is evaluated to have good willingness and ability to achieve nutrition goals.     Nutrition Intervention      - Nutrition Counseling: Problem solving  - Oral Nutrition Supplementation: Ensure, Premier or Pedialyte     Nutrition Plan:   ??? To help with decreased appetite, recommended small, frequent meals. Reviewed various small meal ideas.   ??? To help with blood sugar control on Piqray medication, encouraged pairing a protein source with each meal/snack. Examples reviewed and handout on snack ideas from the American Diabetes Association provided. Reviewed portioning the plate for DM/blood sugar control and handout on portioning the plate for DM provided.   ??? Reviewed ways to boost up calories with meals/snacks.   ??? Encouraged continuing with nutritional shakes as desired, as tolerated. She is currently drinking ~ 1 per day.   ??? Encouraged low sugar/reduced sugar beverages/liquids except for her nutritional shakes such as Ensure.     Follow up will occur in 1 month as able, as desired by patient .       Food/Nutrition-related history, Anthropometric measurements, Biochemical data, medical tests, procedures, Nutrition-focused physical findings, Patient understanding or compliance with intervention and recommendations  and Effectiveness of nutrition interventions will be assessed at time of follow-up.     Recommendations for Care Team :  Encourage small, frequent meals.   Recommend pairing source of protein with each meal to help with blood sugar control.   Encourage continuing with oral nutritional shakes for added nutrition.       Time spent 30 minutes.

## 2021-12-11 DIAGNOSIS — C7951 Secondary malignant neoplasm of bone: Principal | ICD-10-CM

## 2021-12-11 DIAGNOSIS — R739 Hyperglycemia, unspecified: Principal | ICD-10-CM

## 2021-12-11 DIAGNOSIS — T50905A Adverse effect of unspecified drugs, medicaments and biological substances, initial encounter: Principal | ICD-10-CM

## 2021-12-11 NOTE — Unmapped (Signed)
Fasting blood glucose arranged to be completed at local [facility] this Thursday 6/8 per [facility] verbal order. Pt aware and agrees with plan.

## 2021-12-13 NOTE — Unmapped (Signed)
[facility] Clinical Assessment & Refill Coordination Note    Karen Hays, DOB: 12-14-1950  Phone: [phone] (home)     All above HIPAA information was verified with patient.     Was a Nurse, learning disability used for this call? No    Specialty Medication(s):   Hematology/Oncology: Piqray 150mg      Current Outpatient Medications   Medication Sig Dispense Refill    alpelisib 300 mg/day (150 mg x 2) Tab Take 2 tablets (300 mg) by mouth daily. 56 tablet 3    ALPRAZolam (XANAX) 0.5 MG tablet Take 1 tablet (0.5 mg total) by mouth.      cholecalciferol, vitamin D3, 25 mcg (1,000 unit) capsule Take 1 capsule (25 mcg total) by mouth.      furosemide (LASIX) 20 MG tablet Take 1 tablet (20 mg total) by mouth two (2) times a day as needed.      lamoTRIgine (LAMICTAL) 100 MG tablet       metFORMIN (GLUCOPHAGE) 500 MG tablet Take 1 tablet (500 mg total) by mouth in the morning and 1 tablet (500 mg total) in the evening. Take with meals. Start one week prior to starting chemo. 180 tablet 3    metoprolol succinate (TOPROL-XL) 25 MG 24 hr tablet       nicotine (NICODERM CQ) 14 mg/24 hr patch Place 1 patch on the skin daily. 28 patch 0    oxyCODONE (ROXICODONE) 10 mg immediate release tablet TAKE 1 TABLET BY MOUTH EVERY 4 HOURS      pantoprazole (PROTONIX) 40 MG tablet       prochlorperazine (COMPAZINE) 10 MG tablet Take 1 tablet (10 mg total) by mouth every six (6) hours as needed for nausea. 30 tablet 0    simvastatin (ZOCOR) 40 MG tablet        No current facility-administered medications for this visit.     Facility-Administered Medications Ordered in Other Visits   Medication Dose Route Frequency Provider Last Rate Last Admin    denosumab (XGEVA) injection 120 mg  120 mg Subcutaneous Once Jeanie Cooks, MD            Changes to medications: Tsuyako reports no changes at this time.    Allergies   Allergen Reactions    Chantix [Varenicline] Other (See Comments)     Aggression and irritability, nightmares       Changes to allergies: No    SPECIALTY MEDICATION ADHERENCE     Piqray 150 mg: 8 days of medicine on hand   Medication Adherence    Patient reported X missed doses in the last month: 0  Specialty Medication: Piqray          Specialty medication(s) dose(s) confirmed: Regimen is correct and unchanged.     Are there any concerns with adherence? No    Adherence counseling provided? Not needed    CLINICAL MANAGEMENT AND INTERVENTION      Clinical Benefit Assessment:    Do you feel the medicine is effective or helping your condition? Yes    Clinical Benefit counseling provided? Not needed    Adverse Effects Assessment:    Are you experiencing any side effects? No    Are you experiencing difficulty administering your medicine? No    Quality of Life Assessment:    Quality of Life      Oncology  1. What impact has your specialty medication had on the reduction of your daily pain or discomfort level?: None  2. On a scale of 1-10, how would you rate your ability to manage side effects associated with your specialty medication? (1=no issues, 10 = unable to take medication due to side effects): 1            How many days over the past month did your condition/medication  keep you from your normal activities? For example, brushing your teeth or getting up in the morning. 0    Have you discussed this with your provider? Not needed    Acute Infection Status:    Acute infections noted within Epic:  No active infections  Patient reported infection: None    Therapy Appropriateness:    Is therapy appropriate and patient progressing towards therapeutic goals? Yes, therapy is appropriate and should be continued    DISEASE/MEDICATION-SPECIFIC INFORMATION      N/A    PATIENT SPECIFIC NEEDS     Does the patient have any physical, cognitive, or cultural barriers? No    Is the patient high risk? Yes, patient is taking oral chemotherapy. Appropriateness of therapy as been assessed    Does the patient require a Care Management Plan? No           SHIPPING     Specialty Medication(s) to be Shipped:   Hematology/Oncology: Piqray 150mg     Other medication(s) to be shipped: No additional medications requested for fill at this time     Changes to insurance: No    Delivery Scheduled: Yes, Expected medication delivery date: 12/18/21.     Medication will be delivered via UPS to the confirmed prescription address in [facility].    The patient will receive a drug information handout for each medication shipped and additional FDA Medication Guides as required.  Verified that patient has previously received a Conservation officer, historic buildings and a Surveyor, mining.    The patient or caregiver noted above participated in the development of this care plan and knows that they can request review of or adjustments to the care plan at any time.      All of the patient's questions and concerns have been addressed.    Rollen Sox   [facility] Specialty Pharmacist

## 2021-12-14 LAB — GLUCOSE, FASTING: GLUCOSE: 129 mg/dL — ABNORMAL HIGH (ref 70–99)

## 2021-12-14 NOTE — Unmapped (Signed)
Pharmacist Phone Follow-Up    Cancer Team  Medical Oncology: Dr. Laney Pastor  Reason for call: Oral chemotherapy management  Current treatment: Alpelisib + fulvestrant + denosumab     Assessment/Plan  Metastatic breast cancer:  [name] is a 71 y.o. female with metastatic breast cancer and a PIK3CA mutation.  She started fulvestrant on 11/09/21 and started alpelisib on 11/24/21.  She is currently on metformin 500 mg BID.    FBG obtained at [facility] on 6/8 = 129.  Last BGs were 6/2 = 134 and 5/25 =126    Grade 1 hyperglycemia - Continue metformin alpelisib with no dose modifications.  May now check FBG monthly unless otherwise clinically indicated.  Symptoms of hyperglycemia reviewed and instructed her to contact us if they should occur.     Plan:  Continue alpelisib 300 mg qam with low carb breakfast  Continue metformin 500 mg BID with meals  Continue antihistamine for rash prophylaxis  01/04/22: Labs and provider appointment and fulvestrant injection  ________________________________________________________________________     Interval History   Karen Hays is a 71 y.o. female with metastatic breast cancer who is being treated with alpelisib + fulvestrant + denosumab.  She reports that since decreasing her metformin to 500 mg BID her appetite has improved and she has gained 3 pounds.  She generally feels better and also notes that her fatigue has improved.  She does not have any other adverse effects to report.  She had her FBG drawn at [facility] yesterday and her levels are stable at just below 130.    Oral chemotherapy:  Alpelisib 300 mg daily with food  Start date: 11/24/21  Pharmacy: [facility]     Adherence: No missed doses    Adverse Effects:   Appetite - improved  Fatigue - improved  Diarrhea - none    Drug interactions: None identified    Patient verbalized understanding of the above information.     I spent 15 minutes on the phone with the patient on the date of service. I spent an additional 15 minutes on pre- and post-visit activities.     The patient was physically located in [place] or a state in which I am permitted to provide care. The patient and/or parent/guardian understood that s/he may incur co-pays and cost sharing, and agreed to the telemedicine visit. The visit was reasonable and appropriate under the circumstances given the patient's presentation at the time.    The patient and/or parent/guardian has been advised of the potential risks and limitations of this mode of treatment (including, but not limited to, the absence of in-person examination) and has agreed to be treated using telemedicine. The patient's/patient's family's questions regarding telemedicine have been answered.     If the visit was completed in an ambulatory setting, the patient and/or parent/guardian has also been advised to contact their provider???s office for worsening conditions, and seek emergency medical treatment and/or call 911 if the patient deems either necessary.        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       03/2002 - 04/2002 Radiation    External beam radiation to the right breast       06/11/2018 Initial Diagnosis    Breast cancer (CMS-HCC): Mammogram: Solid heterogeneous hypervascular mass in the right breast,3.3 x 2.9 x 2.6 cm  cm.  Left breast unremarkable.  06/26/2018 Biopsy     right breast biopsy: Intermediate grade infiltrating ductal carcinoma, Nottingham 2 (histologic score 3, nuclear score 2, mitotic count 1); ER 95%, PR negative, HER-2 negative, Ki-67 40%  Oncotype Dx 35 (> 15% chemotherapy benefit)       08/26/2018 Biopsy    Left breast Bx: 11:00, 2 cm from nipple, core needle biopsy  -Benign breast tissue with stromal fibrosis        09/08/2018 -  Cancer Staged    CT Chest:  1. Right breast mass consistent with patient's known breast cancer.  2. Right apical masslike pulmonary parenchymal opacities, concerning for adenocarcinoma.  Lung RADS: 4B  3. Bilateral upper lobe predominant groundglass nodular pulmonary parenchymal opacities, indeterminate. Favor infectious/inflammatory (given upper lobe predominance) but can't rule out metastases completely.attention on follow-up.     09/16/2018 -  Cancer Staged    PET CT:  FDG avid spiculated right apical pulmonary mass, which may represent primary lung malignancy versus lung metastasis in the setting of breast cancer. Multiple bilateral subcentimeter pulmonary nodules which demonstrate FDG avidity as above, most compatible with pulmonary metastases.  - Large right FDG avid breast mass, compatible with known diagnosis of breast cancer.  - Increased radiotracer uptake in the left proximal femur, likely osseous metastatic disease.  - Right-sided laryngocele with internal debris.  - Hepatic steatosis.     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     --Small linear T1 hypointense, T2 hyperintense, enhancing focus in the lesser trochanter of the right femur which is nonspecific.     10/09/2018 Biopsy    Bone, femur, left proximal, core biopsy  - Metastatic carcinoma, consistent with patient's known breast carcinoma         10/19/2018 -  Chemotherapy    Letrozole 2.5 mg daily, and palbociclib 125 mg daily 21/28 days       12/28/2018 -  Cancer Staged    CT Chest:  2.6 cm right apical lesion unchanged compared to 09/08/2018; leading diagnostic consideration is for primary lung neoplasm.     Multiple additional pulmonary nodules nodules unchanged compared to 09/08/2018; indeterminate, but favor lung adenocarcinoma spectrum rather than breast cancer metastasis.       08/02/2019 -  Cancer Staged    CT CAP:  Right apical 2.6 cm lesion stable compared to 12/28/2018 and remains concerning for a primary lung neoplasm.  Multiple bilateral nodular opacities are stable compared to prior  NED in AP.         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   Latest known visit with results is:   Telephone on 12/11/2021   Component Date Value Ref Range Status    Glucose 12/13/2021 129 (H)  70 - 99 mg/dL Final       Current medications:     Current Outpatient Medications   Medication Sig Dispense Refill    alpelisib 300 mg/day (150 mg x 2) Tab Take 2 tablets (300 mg) by mouth daily. 56 tablet 3    ALPRAZolam (XANAX) 0.5 MG tablet Take 1 tablet (0.5 mg total) by mouth.      cholecalciferol, vitamin D3, 25 mcg (1,000 unit) capsule Take 1 capsule (25 mcg total) by mouth.      furosemide (LASIX) 20 MG tablet Take 1 tablet (20 mg total) by mouth two (2) times a day as needed.      lamoTRIgine (LAMICTAL) 100 MG tablet       metFORMIN (GLUCOPHAGE) 500 MG tablet Take 1 tablet (500 mg total) by mouth in the morning and 1 tablet (500 mg total) in the evening. Take with meals. Start one week prior to starting chemo. 180 tablet 3    metoprolol succinate (TOPROL-XL) 25 MG 24 hr tablet       nicotine (NICODERM CQ) 14 mg/24 hr patch Place 1 patch on the skin daily. 28 patch 0    oxyCODONE (ROXICODONE) 10 mg immediate release tablet TAKE 1 TABLET BY MOUTH EVERY 4 HOURS      pantoprazole (PROTONIX) 40 MG tablet       prochlorperazine (COMPAZINE) 10 MG tablet Take 1 tablet (10 mg total) by mouth every six (6) hours as needed for nausea. 30 tablet 0    simvastatin (ZOCOR) 40 MG tablet        No current facility-administered medications for this visit.     Facility-Administered Medications Ordered in Other Visits   Medication Dose Route Frequency Provider Last Rate Last Admin    denosumab (XGEVA) injection 120 mg  120 mg Subcutaneous Once Jeanie Cooks, MD

## 2021-12-17 MED FILL — PIQRAY 300 MG/DAY (150 MG X 2) TABLET: ORAL | 28 days supply | Qty: 56 | Fill #1

## 2022-01-04 ENCOUNTER — Ambulatory Visit: Admit: 2022-01-04 | Discharge: 2022-01-04 | Payer: MEDICARE

## 2022-01-04 ENCOUNTER — Institutional Professional Consult (permissible substitution): Admit: 2022-01-04 | Discharge: 2022-01-04 | Payer: MEDICARE

## 2022-01-04 DIAGNOSIS — C7951 Secondary malignant neoplasm of bone: Principal | ICD-10-CM

## 2022-01-04 DIAGNOSIS — C50911 Malignant neoplasm of unspecified site of right female breast: Principal | ICD-10-CM

## 2022-01-04 DIAGNOSIS — C3491 Malignant neoplasm of unspecified part of right bronchus or lung: Principal | ICD-10-CM

## 2022-01-04 DIAGNOSIS — Z170 Estrogen receptor positive status [ER+]: Principal | ICD-10-CM

## 2022-01-04 LAB — CBC W/ AUTO DIFF
BASOPHILS ABSOLUTE COUNT: 0.1 10*9/L (ref 0.0–0.1)
BASOPHILS RELATIVE PERCENT: 1.1 %
EOSINOPHILS ABSOLUTE COUNT: 0.3 10*9/L (ref 0.0–0.5)
EOSINOPHILS RELATIVE PERCENT: 5.6 %
HEMATOCRIT: 38.9 % (ref 34.0–44.0)
HEMOGLOBIN: 13.2 g/dL (ref 11.3–14.9)
LYMPHOCYTES ABSOLUTE COUNT: 1.1 10*9/L (ref 1.1–3.6)
LYMPHOCYTES RELATIVE PERCENT: 19.2 %
MEAN CORPUSCULAR HEMOGLOBIN CONC: 33.9 g/dL (ref 32.0–36.0)
MEAN CORPUSCULAR HEMOGLOBIN: 33.5 pg — ABNORMAL HIGH (ref 25.9–32.4)
MEAN CORPUSCULAR VOLUME: 98.7 fL — ABNORMAL HIGH (ref 77.6–95.7)
MEAN PLATELET VOLUME: 7.9 fL (ref 6.8–10.7)
MONOCYTES ABSOLUTE COUNT: 0.4 10*9/L (ref 0.3–0.8)
MONOCYTES RELATIVE PERCENT: 6.9 %
NEUTROPHILS ABSOLUTE COUNT: 3.7 10*9/L (ref 1.8–7.8)
NEUTROPHILS RELATIVE PERCENT: 67.2 %
NUCLEATED RED BLOOD CELLS: 0 /100{WBCs} (ref <=4)
PLATELET COUNT: 219 10*9/L (ref 150–450)
RED BLOOD CELL COUNT: 3.94 10*12/L — ABNORMAL LOW (ref 3.95–5.13)
RED CELL DISTRIBUTION WIDTH: 15.3 % — ABNORMAL HIGH (ref 12.2–15.2)
WBC ADJUSTED: 5.5 10*9/L (ref 3.6–11.2)

## 2022-01-04 LAB — CANCER ANTIGEN 27.29: CA 27-29: 42.8 U/mL — ABNORMAL HIGH (ref <=38.6)

## 2022-01-04 LAB — COMPREHENSIVE METABOLIC PANEL
ALBUMIN: 4.3 g/dL (ref 3.4–5.0)
ALKALINE PHOSPHATASE: 113 U/L (ref 46–116)
ALT (SGPT): 64 U/L — ABNORMAL HIGH (ref 10–49)
ANION GAP: 6 mmol/L (ref 5–14)
AST (SGOT): 54 U/L — ABNORMAL HIGH (ref <=34)
BILIRUBIN TOTAL: 0.6 mg/dL (ref 0.3–1.2)
BLOOD UREA NITROGEN: 14 mg/dL (ref 9–23)
BUN / CREAT RATIO: 22
CALCIUM: 10.2 mg/dL (ref 8.7–10.4)
CHLORIDE: 103 mmol/L (ref 98–107)
CO2: 28.7 mmol/L (ref 20.0–31.0)
CREATININE: 0.64 mg/dL
EGFR CKD-EPI (2021) FEMALE: >90 mL/min/{1.73_m2} (ref >=60)
GLUCOSE RANDOM: 148 mg/dL (ref 70–179)
POTASSIUM: 3.9 mmol/L (ref 3.4–4.8)
PROTEIN TOTAL: 7.6 g/dL (ref 5.7–8.2)
SODIUM: 138 mmol/L (ref 135–145)

## 2022-01-04 MED ADMIN — fulvestrant (FASLODEX) injection 500 mg: 500 mg | INTRAMUSCULAR | @ 16:00:00 | Stop: 2022-01-04

## 2022-01-04 NOTE — Unmapped (Unsigned)
Return Visit Note    Patient Name: Karen Hays  Patient Age: 71 y.o.  Encounter Date: 01/04/2022    Referring Physician:   Lovett Calender, Fnp  [address]  [place],  [place] [address].     PCP:   No PCP Per Patient    Cancer Team  Surgical Oncology: Lucretia Roers, MD  Medical Oncology: Adonis Brook, MD      Reason for visit  Breast breast cancer patient here for reassessment and further management; she was previously a Dr. Claude Manges patient and now under the care of Dr. Laney Pastor.    Diagnosis:   1. 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     Last staging: March 2023 showing multiple foci of new uptake within the axial and appendicular skeletons, in addition to the extension of previously seen large left proximal femur lesion consistent with progression of disease.     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       She started fulvestrant on 11/09/21 and started alpelisib (300 mg every AM) on 11/24/21. She initially metformin as instructed 500 mg BID for 3 days followed by 1000 mg BID. However on 5/30         she dropped down to 500 mg BID on her own. She felt that the metformin was causing lost of appetite and diarrhea. She reports some improvement in symptoms since making this                   change. For now she will keep with the current dose of metformin and she will repeat FBG in 1 week.       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         Bone Directed therapy : Xgeva every 8 weeks last given 09/28/21. Due again 11/21/21    2. Primary Lung cancer  -Patient completed right upper lobe lobectomy with lymph node biopsy on 02/18/2020 showing a 2.5 cm tumor, pT1cN0  -She has recovered well and following with Dr. Allayne Gitelman  -CT chest monitoring per Dr. Oneida Alar; Last CT Chest March 2023 showed no evidence of maligancy      3.Smoking addiction  -She has been trying to quit and has tried multiple efforts so far that have failed her.  -declined referral to smoking cessation clinic again today.    Other medical problems include:  -Bipolar disorder  -HTN  -Hx of kindney stones    Supportive care:   Cancer related pain; left hip and right shoulder. She is currently taking oxycodone prn and reports this has been effective.Continue to monitor in follow up  Decreased appetite: She has been utilizing cannabis and reports her appetite has improved and overall she has felt much better.     DISPO:  -Labs reviewed; continue with Alpelisib  -Labs in 4 -5 weeks and fulvstrant> align with MD follow up in August   -  CT C/A/P and bone scan scheduled for 02/05/22 to be completed prior to MD appt.       Interim History:  Karen Hays is a 71 y.o. female who was a previous Dr. Claude Manges patient transferred care to after his Dr. Laney Pastor after his retirement.  Patient returns for follow-up and ongoing management of metastatic breast cancer.  She reports overall feeling well,  reports her enrgery level is improved.  She reports she is eating better, her wt continues to trend down but note at a slower pace. We discussed increasing protein shakes in addition to regular meals throughout the day. If no improvement will consult nutritionist .   She denies diarrhea today, no fever, chills, headache or dizziness.   She also denies new cough or shortness of breath.        She continues to report pain in her hips and shoulders. She is currently taking oxycodone and report this helps. She reports the pain is not as bad since starting treatment. She denies any new or unusual pain. She does report fatigue that has been ongoing and unchanged. She reports her appetite has been good, she denies wt loss.     Ms.Ashline also has a hx of lung cancer; she is s/p resection in August 2021 and was lost to follow-up after that.  Patient reported that she has lost a family member who was like a son to her and this pushed her into severe depression where she stayed at home and would not leave the house.  She followed up in  Feb 2022. She denies depression today.        Breast Cancer History  Oncology History   Breast cancer (CMS-HCC)   01/28/2002 Surgery    Right breast lumpectomy: Intraductal carcinoma in situ with extensive intraductal hyperplasia and fibrocystic disease.       03/2002 - 04/2002 Radiation    External beam radiation to the right breast       06/11/2018 Initial Diagnosis    Breast cancer (CMS-HCC): Mammogram: Solid heterogeneous hypervascular mass in the right breast,3.3 x 2.9 x 2.6 cm  cm.  Left breast unremarkable.       06/26/2018 Biopsy     right breast biopsy: Intermediate grade infiltrating ductal carcinoma, Nottingham 2 (histologic score 3, nuclear score 2, mitotic count 1); ER 95%, PR negative, HER-2 negative, Ki-67 40%  Oncotype Dx 35 (> 15% chemotherapy benefit)       08/26/2018 Biopsy    Left breast Bx: 11:00, 2 cm from nipple, core needle biopsy  -Benign breast tissue with stromal fibrosis        09/08/2018 -  Cancer Staged    CT Chest:  1. Right breast mass consistent with patient's known breast cancer.  2. Right apical masslike pulmonary parenchymal opacities, concerning for adenocarcinoma.  Lung RADS: 4B  3. Bilateral upper lobe predominant groundglass nodular pulmonary parenchymal opacities, indeterminate. Favor infectious/inflammatory (given upper lobe predominance) but can't rule out metastases completely.attention on follow-up.     09/16/2018 -  Cancer Staged    PET CT:  FDG avid spiculated right apical pulmonary mass, which may represent primary lung malignancy versus lung metastasis in the setting of breast cancer. Multiple bilateral subcentimeter pulmonary nodules which demonstrate FDG avidity as above, most compatible with pulmonary metastases.  - Large right FDG avid breast mass, compatible with known diagnosis of breast cancer.  - Increased radiotracer uptake in the left proximal femur, likely osseous metastatic disease.  - Right-sided laryngocele  with internal debris.  - Hepatic steatosis.     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     --Small linear T1 hypointense, T2 hyperintense, enhancing focus in the lesser trochanter of the right femur which is nonspecific.     10/09/2018 Biopsy    Bone, femur, left proximal, core biopsy  - Metastatic carcinoma, consistent with patient's known breast carcinoma         10/19/2018 -  Chemotherapy    Letrozole 2.5 mg daily, and palbociclib 125 mg daily 21/28 days       12/28/2018 -  Cancer Staged    CT Chest:  2.6 cm right apical lesion unchanged compared to 09/08/2018; leading diagnostic consideration is for primary lung neoplasm.     Multiple additional pulmonary nodules nodules unchanged compared to 09/08/2018; indeterminate, but favor lung adenocarcinoma spectrum rather than breast cancer metastasis.       08/02/2019 -  Cancer Staged    CT CAP:  Right apical 2.6 cm lesion stable compared to 12/28/2018 and remains concerning for a primary lung neoplasm.  Multiple bilateral nodular opacities are stable compared to prior  NED in AP.         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)       11/09/2021 -  Chemotherapy    OP BREAST FULVESTRANT  fulvestrant 500 mg IM on days 1, 15 on Cycle 1, then day 1 of every cycle thereafter, every 28 days       Adenocarcinoma of right lung (CMS-HCC)   02/18/2020 Initial Diagnosis    Adenocarcinoma of right lung (CMS-HCC)       11/09/2021 -  Chemotherapy    OP BREAST FULVESTRANT  fulvestrant 500 mg IM on days 1, 15 on Cycle 1, then day 1 of every cycle thereafter, every 28 days           PMH:  Past Medical History:   Diagnosis Date    Bipolar affective disorder (CMS-HCC)     Breast cancer (CMS-HCC)     right    GERD (gastroesophageal reflux disease)     Hyperlipidemia     Hypertension     Kidney stones        PSH:  Past Surgical History:   Procedure Laterality Date    BREAST BIOPSY  BREAST LUMPECTOMY      PR BX/REMV,LYMPH NODE,DEEP AXILL Right 09/18/2018    Procedure: BX/EXC LYMPH NODE; OPEN, DEEP AXILRY NODE;  Surgeon: Aris Everts, MD;  Location: ASC OR [facility];  Service: Surgical Oncology Breast    PR INTRAOPERATIVE SENTINEL LYMPH NODE ID W DYE INJECTION Right 09/18/2018    Procedure: INTRAOPERATIVE IDENTIFICATION SENTINEL LYMPH NODE(S) INCLUDE INJECTION NON-RADIOACTIVE DYE, WHEN PERFORMED;  Surgeon: Aris Everts, MD;  Location: ASC OR [facility];  Service: Surgical Oncology Breast    PR MASTECTOMY, SIMPLE, COMPLETE Right 09/18/2018 09/18/2018    Procedure: MASTECTOMY, SIMPLE, COMPLETE;  Surgeon: Aris Everts, MD;  Location: ASC OR [facility];  Service: Surgical Oncology Breast   ??? PR THORACOSCOPY SURG LOBECTOMY Right 02/18/2020    Procedure: THORACOSCOPY, SURGICAL; WITH LOBECTOMY (SINGLE LOBE);  Surgeon: Evert Kohl, MD;  Location: MAIN OR [facility];  Service: Thoracic   ??? RADIATION         Allergies:  Chantix [varenicline]    Medications:  has a current medication list which includes the following prescription(s): letrozole, alpelisib, alprazolam, cholecalciferol (vitamin d3-25 mcg (1,000 unit)), furosemide, lamotrigine, metformin, metoprolol succinate, nicotine, oxycodone, pantoprazole, prochlorperazine, and simvastatin, and the following Facility-Administered Medications: denosumab and fulvestrant.    Personal and Social History:  Social History     Social History Narrative   ??? Not on file       Review of Systems: A complete review of systems was obtained including: Constitutional, Eyes, ENT, Cardiovascular, Respiratory, GI, GU, Musculoskeletal, Skin, Neurological, Psychiatric, Endocrine, Heme/Lymphatic, and Allergic/Immunologic systems. It is negative or non-contributory to the patient???s management except for what is detailed in the Interim History.    This was a tele health appointment so exam was limited.   General: Well appearing with no acute distress visible via video teleconferencing  Neuro: Alert and oriented x 4.  Fluent speech, no dysphasia  Respiratory: Normal Respiratory effort. Able to speak in full sentences without difficulty  Psych: Appropriate mood and affect. Thought content linear and appropriate    PRIOR Physical Exam:  GENERAL: Well appearing female in NAD.  VITAL SIGNS: BP 142/64  - Pulse 67  - Temp 35.7 ??C (96.3 ??F)  - Ht 180.3 cm (5' 11)  - Wt 60.5 kg (133 lb 6.4 oz)  - BMI 18.61 kg/m??   BMI: Body mass index is 18.61 kg/m??.  HEENT: Normocephalic, symmetric. EOMI. Sclera clear. Oropharynx benign.  LYMPH:No supraclavicular, No axillary lymphadenopathy.  CHEST/BREASTS: (Deffered today ) Well healed mastectomy incision breast absent, No evidence of locoregional recurrence. Contralateral breast without mass, skin change, retraction or nipple discharge.    LUNGS: respirations even and unlabored.clear to ausculation bilaterally.  CARDIAC: RRR, no murmurs.   ABDOMEN: Soft, nontender, no hepatomegaly or masses  SKIN & SUBCUTANEOUS TISSUES: No/Yes rash, ecchymoses, purpuric lesions noted.  EXTREMITIES: No/Yes upper or lower extremity edema, normal skin tone. No ymphedema noted.  MSK: Spine is nontender.  NEURO: Alert, oriented, normal gait &coordination.    Orders/Results:  Appointment on 01/04/2022   Component Date Value    Sodium 01/04/2022 138     Potassium 01/04/2022 3.9     Chloride 01/04/2022 103     CO2 01/04/2022 28.7     Anion Gap 01/04/2022 6     BUN 01/04/2022 14     Creatinine 01/04/2022 0.64     BUN/Creatinine Ratio 01/04/2022 22     eGFR CKD-EPI (2021) Fema* 01/04/2022 >90     Glucose 01/04/2022 148     Calcium  01/04/2022 10.2     Albumin 01/04/2022 4.3     Total Protein 01/04/2022 7.6     Total Bilirubin 01/04/2022 0.6     AST 01/04/2022 54 (H)     ALT 01/04/2022 64 (H)     Alkaline Phosphatase 01/04/2022 113     WBC 01/04/2022 5.5     RBC 01/04/2022 3.94 (L)     HGB 01/04/2022 13.2     HCT 01/04/2022 38.9     MCV 01/04/2022 98.7 (H)     MCH 01/04/2022 33.5 (H)     MCHC 01/04/2022 33.9     RDW 01/04/2022 15.3 (H)     MPV 01/04/2022 7.9     Platelet 01/04/2022 219     nRBC 01/04/2022 0     Neutrophils % 01/04/2022 67.2     Lymphocytes % 01/04/2022 19.2     Monocytes % 01/04/2022 6.9     Eosinophils % 01/04/2022 5.6     Basophils % 01/04/2022 1.1     Absolute Neutrophils 01/04/2022 3.7     Absolute Lymphocytes 01/04/2022 1.1     Absolute Monocytes 01/04/2022 0.4     Absolute Eosinophils 01/04/2022 0.3     Absolute Basophils 01/04/2022 0.1      Results for orders placed or performed in visit on 01/04/22 Comprehensive Metabolic Panel   Result Value Ref Range    Sodium 138 135 - 145 mmol/L    Potassium 3.9 3.4 - 4.8 mmol/L    Chloride 103 98 - 107 mmol/L    CO2 28.7 20.0 - 31.0 mmol/L    Anion Gap 6 5 - 14 mmol/L    BUN 14 9 - 23 mg/dL    Creatinine 1.61 0.96 - 0.80 mg/dL    BUN/Creatinine Ratio 22     eGFR CKD-EPI (2021) Female >90 >=60 mL/min/1.69m2    Glucose 148 70 - 179 mg/dL    Calcium 04.5 8.7 - 40.9 mg/dL    Albumin 4.3 3.4 - 5.0 g/dL    Total Protein 7.6 5.7 - 8.2 g/dL    Total Bilirubin 0.6 0.3 - 1.2 mg/dL    AST 54 (H) <=81 U/L    ALT 64 (H) 10 - 49 U/L    Alkaline Phosphatase 113 46 - 116 U/L   CBC w/ Differential   Result Value Ref Range    WBC 5.5 3.6 - 11.2 10*9/L    RBC 3.94 (L) 3.95 - 5.13 10*12/L    HGB 13.2 11.3 - 14.9 g/dL    HCT 19.1 47.8 - 29.5 %    MCV 98.7 (H) 77.6 - 95.7 fL    MCH 33.5 (H) 25.9 - 32.4 pg    MCHC 33.9 32.0 - 36.0 g/dL    RDW 62.1 (H) 30.8 - 15.2 %    MPV 7.9 6.8 - 10.7 fL    Platelet 219 150 - 450 10*9/L    nRBC 0 <=4 /100 WBCs    Neutrophils % 67.2 %    Lymphocytes % 19.2 %    Monocytes % 6.9 %    Eosinophils % 5.6 %    Basophils % 1.1 %    Absolute Neutrophils 3.7 1.8 - 7.8 10*9/L    Absolute Lymphocytes 1.1 1.1 - 3.6 10*9/L    Absolute Monocytes 0.4 0.3 - 0.8 10*9/L    Absolute Eosinophils 0.3 0.0 - 0.5 10*9/L    Absolute Basophils 0.1 0.0 - 0.1 10*9/L

## 2022-01-04 NOTE — Unmapped (Signed)
Fulvestrant injections given per therapy plan without complication. Patient tolerated well.

## 2022-01-04 NOTE — Unmapped (Signed)
[facility]   Medical Nutrition Therapy Consultation       Visit Type:    Return Assessment    Referral Reason:   Nutrition Follow-up    Karen Hays is a 71 y.o. female seen for medical nutrition therapy follow-up for diabetic diet education in the setting if Piqray to help prevent hyperglycemia. Dx of breast cancer with mets to the bone. Metformin decreased to 1 tablet daily. She reports feeling better. Continues to work towards gaining weight while also managing blood sugars. Her medication list, allergies, notes from last several encounters and lab results were reviewed.     Past Medical History:   Diagnosis Date   ??? Bipolar affective disorder (CMS-HCC)    ??? Breast cancer (CMS-HCC)     right   ??? GERD (gastroesophageal reflux disease)    ??? Hyperlipidemia    ??? Hypertension    ??? Kidney stones      Anthropometrics   Height:  180.3 cm  Current Weight:   60.5 (kg) 01/04/22  BMI: 18.61 kg/m2   Ideal Body Weight:  70.5 (kg)   Percent Ideal Body Weight: 86%    Weight history:  Wt Readings from Last 6 Encounters:   01/04/22 60.5 kg (133 lb 6.4 oz)   12/07/21 61.4 kg (135 lb 4.8 oz)   11/09/21 65.4 kg (144 lb 1.6 oz)   09/28/21 67.4 kg (148 lb 11.2 oz)   06/15/21 71.8 kg (158 lb 4.8 oz)   02/28/21 69.4 kg (153 lb)     Nutrition Risk Screening:   Food Insecurity: Not on file      Physical Activity:  Physical activity level is light with some exercise.     ASPEN/AND Malnutrition Screening:   Pt meets ASPEN/AND criteria for malnutrition as noted below:  Malnutrition Designation: Severe in context of chronic illness-continues, resolving  -- Weight loss: >5% weight loss x 1 month (6% x 1 month)  -- Energy Intake: Intake <75% of estimated needs for 1 month  -- Muscle Mass: mild to moderate temporal wasting; mild to moderate interosseous wasting  -- Fat Mass: unable to fully assess  -- Fluid Accumulation: did not assess  -- Functional Assessment: did not assess    Biochemical Data, tablet 3   ??? ALPRAZolam (XANAX) 0.5 MG tablet Take 1 tablet (0.5 mg total) by mouth.     ??? cholecalciferol, vitamin D3, 25 mcg (1,000 unit) capsule Take 1 capsule (25 mcg total) by mouth.     ??? furosemide (LASIX) 20 MG tablet Take 1 tablet (20 mg total) by mouth two (2) times a day as needed.     ??? lamoTRIgine (LAMICTAL) 100 MG tablet      ??? letrozole (FEMARA) 2.5 mg tablet Take 1 tablet (2.5 mg total) by mouth daily.     ??? metFORMIN (GLUCOPHAGE) 500 MG tablet Take 1 tablet (500 mg total) by mouth in the morning and 1 tablet (500 mg total) in the evening. Take with meals. Start one week prior to starting chemo. 180 tablet 3   ??? metoprolol succinate (TOPROL-XL) 25 MG 24 hr tablet      ??? nicotine (NICODERM CQ) 14 mg/24 hr patch Place 1 patch on the skin daily. (Patient not taking: Reported on 01/04/2022) 28 patch 0   ??? oxyCODONE (ROXICODONE) 10 mg immediate release tablet TAKE 1 TABLET BY MOUTH EVERY 4 HOURS     ??? pantoprazole (PROTONIX) 40 MG tablet      ???  prochlorperazine (COMPAZINE) 10 MG tablet Take 1 tablet (10 mg total) by mouth every six (6) hours as needed for nausea. 30 tablet 0   ??? simvastatin (ZOCOR) 40 MG tablet        No current facility-administered medications for this visit.     Facility-Administered Medications Ordered in Other Visits   Medication Dose Route Frequency Provider Last Rate Last Admin   ??? denosumab (XGEVA) injection 120 mg  120 mg Subcutaneous Once Jeanie Cooks, MD           Nutrition History:     Dietary Restrictions: {Dietary Restrictions:60153}    Gastrointestinal Issues: {GI Issues :60156}    Oral Issues: mucositis/mouth sores, dry mouth, thick saliva, loss of taste/taste changes, smell changes, sore throat, dysphagia, bleeding, poor dentition, partial/full dentures    Hunger and Satiety: {Hunger and Satiety:60157}    Food Safety and Access: {Food Safety and Access :59304}    Other: pain      Diet Recall:   Breakfast:  Lunch:  Dinner:  Snack(s)  Beverages Alcohol:  Supplements:  Enteral feedings:    Food-Related History:  Snacks:  ***  Beverages:  ***  Dining Out:  ***  Cooking Methods: ***  Usual Food Choices: ***   Meal Schedule:  ***       Nutrition Assessment       ***      Daily Estimated Nutritional Needs:  Energy:   kcals [  using  ,  ]  Protein:   gm [  using  ,  ]  Carbohydrate:   [ ]   Fluid:   [ ]     {Other Nutrients:60227}     Nutrition Goals & Evaluation      ***  ({MNT Goal Progress:77446})  ***  ({MNT Goal Progress:77446})  ***  ({MNT Goal Progress:77446})  ***  ({MNT Goal Progress:77446})  ***  ({MNT Goal Progress:77446})    Nutrition goals reviewed, and relevant barriers identified and addressed: {MNT Barriers to Care:77905}. She is evaluated to have {DESC; GOOD/FAIR/POOR:18685} willingness and ability to achieve nutrition goals.     Nutrition Intervention      {MNT Intervention:72370}    Nutrition Plan:   ??? Recommended continuing to pair a source of protein with each meal to assist with blood sugar control.   ??? Discussed ways to continue to boost up calories in the diet to aid in weight gain without affecting blood sugars.   ??? Encouraged 2 nutritional shakes per day. Recommended drinking each shake as a snack option in between meals.    Will remain available as needed, as desired by patient.       {MNT Monitoring at Follow-up:76793} will be assessed at time of follow-up.     Recommendations for {ONS Recommendations:77904}:  ***       {MNT Time Spent Proofreader or Virtual):78573} Problem solving  - Oral Nutrition Supplementation: Nutritional shakes 2 per day    Nutrition Plan:   ??? Recommended continuing to pair a source of protein with each meal to assist with blood sugar control.   ??? Discussed ways to continue to boost up calories in the diet to aid in weight gain without affecting blood sugars.   ??? Encouraged 2 nutritional shakes per day such as Ensure or Premier Protein. Recommended drinking each shake as a snack option in between meals.    Will remain available as needed, as desired by patient.     Food/Nutrition-related history, Anthropometric measurements, Biochemical data, medical tests,  procedures, Nutrition-focused physical findings, Patient understanding or compliance with intervention and recommendations  and Effectiveness of nutrition interventions will be assessed at time of follow-up.     Recommendations for Care Team :  Encourage 2 nutritional shakes daily for added nutrition .  Recommend continuing to increase calories and protein in the diet.       Time spent 11 minutes.

## 2022-01-07 NOTE — Unmapped (Signed)
[facility] Refill Coordination Note    Specialty Medication(s) to be Shipped:   Hematology/Oncology: Piqray 300mg /day    Other medication(s) to be shipped: No additional medications requested for fill at this time     Karen Hays, DOB: 19-Mar-1951  Phone: [phone] (home)       All above HIPAA information was verified with patient.     Was a Nurse, learning disability used for this call? No    Completed refill call assessment today to schedule patient's medication shipment from the [facility] [phone]).  All relevant notes have been reviewed.     Specialty medication(s) and dose(s) confirmed: Regimen is correct and unchanged.   Changes to medications: Stacey reports no changes at this time.  Changes to insurance: No  New side effects reported not previously addressed with a pharmacist or physician: None reported  Questions for the pharmacist: No    Confirmed patient received a Conservation officer, historic buildings and a Surveyor, mining with first shipment. The patient will receive a drug information handout for each medication shipped and additional FDA Medication Guides as required.       DISEASE/MEDICATION-SPECIFIC INFORMATION        N/A    SPECIALTY MEDICATION ADHERENCE     Medication Adherence    Patient reported X missed doses in the last month: 0  Specialty Medication: PIQRAY 300 mg/day (150 mg x 2) Tab (alpelisib)  Patient is on additional specialty medications: No  Informant: patient              Were doses missed due to medication being on hold? No    Piqray 300 mg/day: 10 days of medicine on hand     REFERRAL TO PHARMACIST     Referral to the pharmacist: Not needed      [facility]     Shipping address confirmed in Epic.     Delivery Scheduled: Yes, Expected medication delivery date: 01/15/22.     Medication will be delivered via UPS to the prescription address in Epic WAM.    Jasper Loser   [facility] Specialty Technician

## 2022-01-15 MED FILL — PIQRAY 300 MG/DAY (150 MG X 2) TABLET: ORAL | 28 days supply | Qty: 56 | Fill #2

## 2022-02-05 ENCOUNTER — Ambulatory Visit: Admit: 2022-02-05 | Payer: MEDICARE

## 2022-02-06 ENCOUNTER — Ambulatory Visit: Admit: 2022-02-06 | Payer: MEDICARE

## 2022-02-11 NOTE — Unmapped (Signed)
[facility] Refill Coordination Note    Specialty Medication(s) to be Shipped:   Hematology/Oncology: Piqray 300 mg    Other medication(s) to be shipped: No additional medications requested for fill at this time     Karen Hays, DOB: 04-08-1951  Phone: [phone] (home)       All above HIPAA information was verified with patient.     Was a Nurse, learning disability used for this call? No    Completed refill call assessment today to schedule patient's medication shipment from the [facility] [phone]).  All relevant notes have been reviewed.     Specialty medication(s) and dose(s) confirmed: Regimen is correct and unchanged.     Changes to medications: Karen Hays reports no changes at this time.  Changes to insurance: No  New side effects reported not previously addressed with a pharmacist or physician: None reported  Questions for the pharmacist: No    Confirmed patient received a Conservation officer, historic buildings and a Surveyor, mining with first shipment. The patient will receive a drug information handout for each medication shipped and additional FDA Medication Guides as required.       DISEASE/MEDICATION-SPECIFIC INFORMATION        N/A    SPECIALTY MEDICATION ADHERENCE     Medication Adherence    Patient reported X missed doses in the last month: 0  Specialty Medication: PIQRAY 300 mg/day (150 mg x 2) Tab (alpelisib)  Patient is on additional specialty medications: No  Informant: patient                                Were doses missed due to medication being on hold? No    Piqray 300  mg: 7 days of medicine on hand       REFERRAL TO PHARMACIST     Referral to the pharmacist: Not needed      [facility]     Shipping address confirmed in Epic.     Delivery Scheduled: Yes, Expected medication delivery date: 02/14/22.     Medication will be delivered via UPS to the prescription address in Epic WAM.    Karen Hays   [facility] Specialty Technician

## 2022-02-13 MED FILL — PIQRAY 300 MG/DAY (150 MG X 2) TABLET: ORAL | 28 days supply | Qty: 56 | Fill #3

## 2022-02-21 NOTE — Unmapped (Signed)
Called and spoke with pt and got her scheduled for a visit to see Lovett Calender in [place] on 8/25, labs and injection. Pt verbally confirmed appt dates and times

## 2022-02-22 DIAGNOSIS — C7951 Secondary malignant neoplasm of bone: Principal | ICD-10-CM

## 2022-02-22 MED ORDER — ALPELISIB 300 MG/DAY (150 MG X 2) TABLET
ORAL_TABLET | Freq: Every day | ORAL | 3 refills | 28 days | Status: CP
Start: 2022-02-22 — End: ?
  Filled 2022-03-13: qty 56, 28d supply, fill #0

## 2022-02-22 NOTE — Unmapped (Signed)
Hi,    Patient Jaeanna Rock Nephew called requesting a medication refill for the following:    Medication: Alpelisib  Dosage: 300 mg/day  Days left of medication: 0  Pharmacy: [facility]  [address] [place], [place] [address]  Phone: [phone]  Fax: [phone]         The expected turnaround time is 3-4 business days       Check Indicates criteria has been reviewed and confirmed with the patient:    [x]  Preferred Name   [x]  DOB and/or MR#  [x]  Preferred Contact Method  [x]  Phone Number(s)   [x]  Preferred Pharmacy   [x]  MyChart     Thank you,  Iona Hansen  [facility]  [phone]

## 2022-02-28 ENCOUNTER — Ambulatory Visit: Admit: 2022-02-28 | Discharge: 2022-03-01 | Payer: MEDICARE

## 2022-02-28 ENCOUNTER — Ambulatory Visit: Admit: 2022-02-28 | Discharge: 2022-02-28 | Payer: MEDICARE

## 2022-02-28 MED ADMIN — Technetium Tc-99m Oxidronate HDP: 20.6 | INTRAVENOUS | @ 14:00:00 | Stop: 2022-02-28

## 2022-02-28 MED ADMIN — iohexoL (OMNIPAQUE) 350 mg iodine/mL solution 100 mL: 100 mL | INTRAVENOUS | @ 15:00:00 | Stop: 2022-02-28

## 2022-03-01 ENCOUNTER — Institutional Professional Consult (permissible substitution): Admit: 2022-03-01 | Discharge: 2022-03-01 | Payer: MEDICARE

## 2022-03-01 ENCOUNTER — Ambulatory Visit: Admit: 2022-03-01 | Discharge: 2022-03-01 | Payer: MEDICARE

## 2022-03-01 DIAGNOSIS — Z170 Estrogen receptor positive status [ER+]: Principal | ICD-10-CM

## 2022-03-01 DIAGNOSIS — C3491 Malignant neoplasm of unspecified part of right bronchus or lung: Principal | ICD-10-CM

## 2022-03-01 DIAGNOSIS — C7951 Secondary malignant neoplasm of bone: Principal | ICD-10-CM

## 2022-03-01 DIAGNOSIS — C50911 Malignant neoplasm of unspecified site of right female breast: Principal | ICD-10-CM

## 2022-03-01 LAB — COMPREHENSIVE METABOLIC PANEL
ALBUMIN: 3.9 g/dL (ref 3.4–5.0)
ALKALINE PHOSPHATASE: 158 U/L — ABNORMAL HIGH (ref 46–116)
ALT (SGPT): 42 U/L (ref 10–49)
ANION GAP: 9 mmol/L (ref 5–14)
AST (SGOT): 41 U/L — ABNORMAL HIGH (ref <=34)
BILIRUBIN TOTAL: 0.5 mg/dL (ref 0.3–1.2)
BLOOD UREA NITROGEN: 18 mg/dL (ref 9–23)
BUN / CREAT RATIO: 30
CALCIUM: 10.6 mg/dL — ABNORMAL HIGH (ref 8.7–10.4)
CHLORIDE: 100 mmol/L (ref 98–107)
CO2: 27.4 mmol/L (ref 20.0–31.0)
CREATININE: 0.6 mg/dL
EGFR CKD-EPI (2021) FEMALE: >90 mL/min/{1.73_m2} (ref >=60)
GLUCOSE RANDOM: 156 mg/dL (ref 70–179)
POTASSIUM: 4 mmol/L (ref 3.4–4.8)
PROTEIN TOTAL: 7.4 g/dL (ref 5.7–8.2)
SODIUM: 136 mmol/L (ref 135–145)

## 2022-03-01 LAB — CBC W/ AUTO DIFF
BASOPHILS ABSOLUTE COUNT: 0.1 10*9/L (ref 0.0–0.1)
BASOPHILS RELATIVE PERCENT: 0.9 %
EOSINOPHILS ABSOLUTE COUNT: 0 10*9/L (ref 0.0–0.5)
EOSINOPHILS RELATIVE PERCENT: 0.5 %
HEMATOCRIT: 35.9 % (ref 34.0–44.0)
HEMOGLOBIN: 12.4 g/dL (ref 11.3–14.9)
LYMPHOCYTES ABSOLUTE COUNT: 1.7 10*9/L (ref 1.1–3.6)
LYMPHOCYTES RELATIVE PERCENT: 28.6 %
MEAN CORPUSCULAR HEMOGLOBIN CONC: 34.5 g/dL (ref 32.0–36.0)
MEAN CORPUSCULAR HEMOGLOBIN: 32.7 pg — ABNORMAL HIGH (ref 25.9–32.4)
MEAN CORPUSCULAR VOLUME: 94.8 fL (ref 77.6–95.7)
MEAN PLATELET VOLUME: 7.2 fL (ref 6.8–10.7)
MONOCYTES ABSOLUTE COUNT: 0.4 10*9/L (ref 0.3–0.8)
MONOCYTES RELATIVE PERCENT: 6.1 %
NEUTROPHILS ABSOLUTE COUNT: 3.9 10*9/L (ref 1.8–7.8)
NEUTROPHILS RELATIVE PERCENT: 63.9 %
NUCLEATED RED BLOOD CELLS: 0 /100{WBCs} (ref <=4)
PLATELET COUNT: 195 10*9/L (ref 150–450)
RED BLOOD CELL COUNT: 3.79 10*12/L — ABNORMAL LOW (ref 3.95–5.13)
RED CELL DISTRIBUTION WIDTH: 15 % (ref 12.2–15.2)
WBC ADJUSTED: 6 10*9/L (ref 3.6–11.2)

## 2022-03-01 LAB — CREATININE
CREATININE: 0.62 mg/dL
EGFR CKD-EPI (2021) FEMALE: >90 mL/min/{1.73_m2} (ref >=60)

## 2022-03-01 LAB — CANCER ANTIGEN 27.29: CA 27-29: 56 U/mL — ABNORMAL HIGH (ref <=38.6)

## 2022-03-01 LAB — CALCIUM: CALCIUM: 10.7 mg/dL — ABNORMAL HIGH (ref 8.7–10.4)

## 2022-03-01 LAB — PHOSPHORUS: PHOSPHORUS: 4.1 mg/dL (ref 2.4–5.1)

## 2022-03-01 LAB — ALBUMIN: ALBUMIN: 3.9 g/dL (ref 3.4–5.0)

## 2022-03-01 MED ORDER — PROCHLORPERAZINE MALEATE 10 MG TABLET
ORAL_TABLET | Freq: Four times a day (QID) | ORAL | 0 refills | 8 days | Status: CP | PRN
Start: 2022-03-01 — End: ?

## 2022-03-01 MED ADMIN — fulvestrant (FASLODEX) injection 500 mg: 500 mg | INTRAMUSCULAR | @ 18:00:00 | Stop: 2022-03-01

## 2022-03-01 MED ADMIN — denosumab (XGEVA) injection 120 mg: 120 mg | SUBCUTANEOUS | @ 18:00:00 | Stop: 2022-03-01

## 2022-03-01 NOTE — Unmapped (Signed)
Patient received Faslodex injections per therapy plan without complications. Labs within parameters for Xgeva - injection given per therapy plan. Patient tolerated well.

## 2022-03-01 NOTE — Unmapped (Signed)
Return Visit Note    Patient Name: Karen Hays  Patient Age: 71 y.o.  Encounter Date: 03/01/2022    Referring Physician:   Lovett Calender, Fnp  [address]  [place],  [place] [address].     PCP:   No PCP Per Patient    Cancer Team  Surgical Oncology: Lucretia Roers, MD  Medical Oncology: Adonis Brook, MD      Reason for visit  Breast breast cancer patient here for reassessment and further management; she was previously a Dr. Claude Manges patient and now under the care of Dr. Laney Pastor.    Diagnosis:   1. 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     Last staging: March 2023 showing multiple foci of new uptake within the axial and appendicular skeletons, in addition to the extension of previously seen large left proximal femur lesion consistent with progression of disease.     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       She started fulvestrant on 11/09/21 and started alpelisib (300 mg every AM) on 11/24/21.        She initially metformin as instructed 500 mg BID for 3 days followed by 1000 mg BID. However on 5/30 she dropped down to 500 mg BID on her own. She felt that the metformin was causing lost of appetite and diarrhea. Since making this change she reports symptoms have improved. Additionally her BG have remain acceptable when checked in clinic.     Updated imaging reveals somewhat of a mixed response, she has new enhancing lesion concerning for new liver metastasis. She is scheduled to meet with Dr. Laney Pastor in a week to discuss results further and determine plan. For now she will continue with her current treatment Apleisib +Fulvestrant.     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         Bone Directed therapy : Xgeva every 8 weeks last given 03/01/2022    2. Primary Lung cancer  -Patient completed right upper lobe lobectomy with lymph node biopsy on 02/18/2020 showing a 2.5 cm tumor, pT1cN0  -She has recovered well and following with Dr. Allayne Gitelman  -CT chest monitoring per Dr. Oneida Alar; Last CT Chest August 2023 showed no evidence of malignancy stable pulmonary nodules.     3.Smoking addiction  -She has been trying to quit and has tried multiple efforts so far that have failed her.  -declined referral to smoking cessation clinic again today.    Other medical problems include:  -Bipolar disorder  -HTN  -Hx of kindney stones    Supportive care:   Cancer related pain; left hip and right shoulder. She is currently taking oxycodone prn and reports this has been effective.Continue to monitor in follow up  Decreased appetite: She has been utilizing cannabis and reports her appetite has improved and overall she has felt much better.  DISPO:  -Labs reviewed; continue with Alpelisib  -Fulvestrant every 4 weeks  - labs Xgeva every 8 weeks   -Follow up with Dr. Laney Pastor in 1 week.       Interim History:  Karen Hays is a 71 y.o. female who was a previous Dr. Claude Manges patient transferred care to after his Dr. Laney Pastor after his retirement.  Patient returns for follow-up and ongoing management of metastatic breast cancer.    She reports she was sick after the 4th of July, she reports diarrhea, nausea and vomiting. Symptoms for about 3-4 days.   Appetite is poor but she is trying to eat, she is down 8lbs since June.   She reports nausea currently, the diarrhea is now more intermittent she is using imodium as needed.   She reports in the right hip stabbing characterized and intermittent, she takes oxycodone prn for pain.   She reports her energy level has been okay.     She denies diarrhea today, no fever, chills, no headache. Does report some  dizziness only when she gets up too quickly   She also denies new cough or shortness of breath.        Karen Hays also has a hx of lung cancer; she is s/p resection in August 2021 and was lost to follow-up after that.  Patient reported that she has lost a family member who was like a son to her and this pushed her into severe depression where she stayed at home and would not leave the house.  She followed up in  Feb 2022. She denies depression today.        Breast Cancer History  Oncology History   Breast cancer (CMS-HCC)   01/28/2002 Surgery    Right breast lumpectomy: Intraductal carcinoma in situ with extensive intraductal hyperplasia and fibrocystic disease.     03/2002 - 04/2002 Radiation    External beam radiation to the right breast     06/11/2018 Initial Diagnosis    Breast cancer (CMS-HCC): Mammogram: Solid heterogeneous hypervascular mass in the right breast,3.3 x 2.9 x 2.6 cm  cm.  Left breast unremarkable.     06/26/2018 Biopsy     right breast biopsy: Intermediate grade infiltrating ductal carcinoma, Nottingham 2 (histologic score 3, nuclear score 2, mitotic count 1); ER 95%, PR negative, HER-2 negative, Ki-67 40%  Oncotype Dx 35 (> 15% chemotherapy benefit)     08/26/2018 Biopsy    Left breast Bx: 11:00, 2 cm from nipple, core needle biopsy  -Benign breast tissue with stromal fibrosis        09/08/2018 -  Cancer Staged    CT Chest:  1. Right breast mass consistent with patient's known breast cancer.  2. Right apical masslike pulmonary parenchymal opacities, concerning for adenocarcinoma.  Lung RADS: 4B  3. Bilateral upper lobe predominant groundglass nodular pulmonary parenchymal opacities, indeterminate. Favor infectious/inflammatory (given upper lobe predominance) but can't rule out metastases completely.attention on follow-up.     09/16/2018 -  Cancer Staged    PET CT:  FDG avid spiculated right apical pulmonary mass, which may represent primary lung malignancy versus lung metastasis in the setting of breast cancer. Multiple bilateral subcentimeter pulmonary nodules which demonstrate FDG avidity as above, most compatible with pulmonary metastases.  - Large right FDG avid breast mass, compatible with known diagnosis of breast cancer.  - Increased radiotracer uptake in the left proximal femur, likely osseous metastatic disease.  - Right-sided laryngocele with internal debris.  - Hepatic steatosis.  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     --Small linear T1 hypointense, T2 hyperintense, enhancing focus in the lesser trochanter of the right femur which is nonspecific.     10/09/2018 Biopsy    Bone, femur, left proximal, core biopsy  - Metastatic carcinoma, consistent with patient's known breast carcinoma       10/19/2018 -  Chemotherapy    Letrozole 2.5 mg daily, and palbociclib 125 mg daily 21/28 days     12/28/2018 -  Cancer Staged    CT Chest:  2.6 cm right apical lesion unchanged compared to 09/08/2018; leading diagnostic consideration is for primary lung neoplasm.     Multiple additional pulmonary nodules nodules unchanged compared to 09/08/2018; indeterminate, but favor lung adenocarcinoma spectrum rather than breast cancer metastasis.       08/02/2019 -  Cancer Staged    CT CAP:  Right apical 2.6 cm lesion stable compared to 12/28/2018 and remains concerning for a primary lung neoplasm.  Multiple bilateral nodular opacities are stable compared to prior  NED in AP.         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)     11/09/2021 -  Chemotherapy    OP BREAST FULVESTRANT  fulvestrant 500 mg IM on days 1, 15 on Cycle 1, then day 1 of every cycle thereafter, every 28 days     Adenocarcinoma of right lung (CMS-HCC)   02/18/2020 Initial Diagnosis    Adenocarcinoma of right lung (CMS-HCC)     11/09/2021 -  Chemotherapy    OP BREAST FULVESTRANT  fulvestrant 500 mg IM on days 1, 15 on Cycle 1, then day 1 of every cycle thereafter, every 28 days         PMH:  Past Medical History:   Diagnosis Date    Bipolar affective disorder (CMS-HCC)     Breast cancer (CMS-HCC)     right    GERD (gastroesophageal reflux disease)     Hyperlipidemia     Hypertension     Kidney stones        PSH:  Past Surgical History:   Procedure Laterality Date    BREAST BIOPSY      BREAST LUMPECTOMY      PR BX/REMV,LYMPH NODE,DEEP AXILL Right 09/18/2018    Procedure: BX/EXC LYMPH NODE; OPEN, DEEP AXILRY NODE;  Surgeon: Aris Everts,  MD;  Location: ASC OR [facility];  Service: Surgical Oncology Breast    PR INTRAOPERATIVE SENTINEL LYMPH NODE ID W DYE INJECTION Right 09/18/2018    Procedure: INTRAOPERATIVE IDENTIFICATION SENTINEL LYMPH NODE(S) INCLUDE INJECTION NON-RADIOACTIVE DYE, WHEN PERFORMED;  Surgeon: Aris Everts, MD;  Location: ASC OR [facility];  Service: Surgical Oncology Breast    PR MASTECTOMY, SIMPLE, COMPLETE Right 09/18/2018    Procedure: MASTECTOMY, SIMPLE, COMPLETE;  Surgeon: Aris Everts, MD;  Location: ASC OR [facility];  Service: Surgical Oncology Breast    PR THORACOSCOPY SURG LOBECTOMY Right 02/18/2020    Procedure: THORACOSCOPY, SURGICAL; WITH LOBECTOMY (SINGLE LOBE);  Surgeon: Evert Kohl, MD;  Location: MAIN OR [facility];  Service: Thoracic    RADIATION         Allergies:  Chantix [varenicline]    Medications:  has a current medication list which includes the following prescription(s): alpelisib, alprazolam, cholecalciferol (vitamin d3-25 mcg (1,000 unit)), furosemide, lamotrigine, letrozole, metformin, metoprolol succinate, oxycodone, pantoprazole, prochlorperazine, simvastatin, and nicotine, and the following Facility-Administered Medications: denosumab, fulvestrant, and OKAY TO SEND MEDICATION/CHEMOTHERAPY TO OUTPATIENT UNIT.    Personal and Social History:  Social History     Social History Narrative    Not on file       Review of Systems: A complete review of systems was obtained including: Constitutional, Eyes, ENT, Cardiovascular, Respiratory, GI, GU, Musculoskeletal, Skin, Neurological, Psychiatric, Endocrine, Heme/Lymphatic, and Allergic/Immunologic systems. It is negative or non-contributory to the patient???s management except for what is detailed in the Interim History.    This was a tele health appointment so exam was limited.   General: Well appearing with no acute distress visible via video teleconferencing  Neuro: Alert and oriented x 4.  Fluent speech, no dysphasia  Respiratory: Normal Respiratory effort. Able to speak in full sentences without difficulty  Psych: Appropriate mood and affect. Thought content linear and appropriate    PRIOR Physical Exam:  GENERAL: Well appearing female in NAD.  VITAL SIGNS: BP 140/78  - Pulse 70  - Temp 36.6 ??C (97.9 ??F) (Temporal)  - Resp 16  - Wt 57.1 kg (125 lb 14.4 oz)  - SpO2 95%  - BMI 17.56 kg/m?? BMI: Body mass index is 17.56 kg/m??.  HEENT: Normocephalic, symmetric. EOMI. Sclera clear. Oropharynx benign.  LYMPH:No cervical, No supraclavicular, No axillary lymphadenopathy.  CHEST/BREASTS: (Deffered today ) Well healed mastectomy incision breast absent, No evidence of locoregional recurrence. Contralateral breast without mass, skin change, retraction or nipple discharge.    LUNGS: respirations even and unlabored.clear to ausculation bilaterally.  CARDIAC: RRR, no murmurs.   ABDOMEN: Soft, nontender, no hepatomegaly or masses  SKIN & SUBCUTANEOUS TISSUES: No/Yes rash, ecchymoses, purpuric lesions noted.  EXTREMITIES: No/Yes upper or lower extremity edema, normal skin tone. No ymphedema noted.  MSK: Spine is nontender.  NEURO: Alert, oriented, normal gait &coordination.    Orders/Results:  Hospital Outpatient Visit on 02/28/2022   Component Date Value    Creatinine Whole Blood, * 02/28/2022 0.6 (L)     eGFR CKD-EPI (2021) Fema* 02/28/2022 >90      Results for orders placed or performed during the hospital encounter of 02/28/22   POCT Creatinine   Result Value Ref Range    Creatinine Whole Blood, POC 0.6 (L) 0.7 - 1.1 mg/dL    eGFR CKD-EPI (1610) Female >90 >=60 mL/min/1.48m2

## 2022-03-07 NOTE — Unmapped (Signed)
[facility] Refill Coordination Note    Specialty Medication(s) to be Shipped:   Hematology/Oncology: Piqray 300mg     Other medication(s) to be shipped: No additional medications requested for fill at this time     Karen Hays, DOB: 10-11-1950  Phone: [phone] (home)       All above HIPAA information was verified with patient.     Was a Nurse, learning disability used for this call? No    Completed refill call assessment today to schedule patient's medication shipment from the [facility] [phone]).  All relevant notes have been reviewed.     Specialty medication(s) and dose(s) confirmed: Regimen is correct and unchanged.   Changes to medications: Karen Hays reports no changes at this time.  Changes to insurance: No  New side effects reported not previously addressed with a pharmacist or physician: None reported  Questions for the pharmacist: No    Confirmed patient received a Conservation officer, historic buildings and a Surveyor, mining with first shipment. The patient will receive a drug information handout for each medication shipped and additional FDA Medication Guides as required.       DISEASE/MEDICATION-SPECIFIC INFORMATION        N/A    SPECIALTY MEDICATION ADHERENCE     Medication Adherence    Patient reported X missed doses in the last month: 0  Specialty Medication: PIQRAY 300 mg/day  Patient is on additional specialty medications: No  Patient is on more than two specialty medications: No  Informant: patient  Reliability of informant: reliable  Provider-estimated medication adherence level: good  Reasons for non-adherence: no problems identified                                Were doses missed due to medication being on hold? No    alpelisib 300 mg/day (150 mg x 2) Tab  : 8 days of medicine on hand        REFERRAL TO PHARMACIST     Referral to the pharmacist: Not needed      [facility]     Shipping address confirmed in Epic.     Delivery Scheduled: Yes, Expected medication delivery date: 03/14/22.     Medication will be delivered via UPS to the prescription address in Epic WAM.    Karen Hays [facility] Specialty Technician

## 2022-03-08 NOTE — Unmapped (Signed)
Return Visit Note    Patient Name: Karen Hays  Patient Age: 71 y.o.  Encounter Date: 03/13/2022    Referring Physician:   Lovett Calender, Fnp  [address]  [place],  [place] [address].     PCP:   No PCP Per Patient    Cancer Team  Surgical Oncology: Lucretia Roers, MD  Medical Oncology: Adonis Brook, MD      Reason for visit  Breast breast cancer patient here for reassessment and further management; she was previously a Dr. Claude Manges patient and now under the care of Dr. Laney Pastor.    Diagnosis:   1. 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       She started fulvestrant on 11/09/21 and started alpelisib (300 mg every AM) on 11/24/21.        She initially metformin as instructed 500 mg BID for 3 days followed by 1000 mg BID. However on 5/30 she dropped down to 500 mg BID on her own. She felt that the metformin was causing lost of appetite and diarrhea. Since making this change she reports symptoms have improved. Additionally her BG have remain acceptable. She will continue with the same.      Last staging: August 2023 show stable appearing diffuse osseous metastatic disease with a questionable new focus at the left iliac crest.  CT chest shows stable bilateral nodules with no new or enlarging nodules.  CT abdomen shows stable findings with a questionable interval development of an enhancing 1.5 cm heterogeneous lesion lesion within hepatic segment 8 concerning for new metastatic disease    We had an extensive discussion today concerning the findings on her staging scan.  I explained to the patient that these findings are not definitive for disease progression but somewhat concerning.  She feels clinically well with no signs of clinical disease progression.  We discussed several options including: 1-continuing current therapy with close monitoring of the possible 2 new lesions noted on her recent scans 2-continuing current therapy with plan to perform a biopsy of the liver lesion to confirm metastatic disease.  In the event that the pathology confirms metastatic disease, we will plan to switch to a different line of therapy. 3-proceeding to a different type of treatment approach without the need for biopsy.    Patient opted to proceed with a liver biopsy with plan to switch to a different regimen if biopsy confirms new metastatic disease in liver.  We will arrange for an IR guided biopsy in the next few weeks.  Plan to send that sample for [facility] testing.      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   PARP inhibitor? although BRCA 1 and 2 loss (single copy deletion), consider germline testing if not done        Bone Directed therapy : Xgeva every 8 weeks, next due 04/26/22    2. Primary Lung cancer  -Patient completed right upper lobe lobectomy with lymph node biopsy on 02/18/2020 showing a 2.5 cm tumor, pT1cN0  -She has recovered well and following with Dr. Allayne Gitelman  -CT chest monitoring per Dr. Oneida Alar      3.Smoking addiction  -She has been trying to quit and has tried multiple efforts so far that have failed her.  -declined referral to smoking cessation clinic again today.    Other medical problems include:  -Bipolar disorder  -HTN  -Hx of kindney stones    Supportive care:   Cancer related pain;. She is currently taking oxycodone prn and reports this has been effective. She reports this is well controlled today. Continue to monitor in follow up  Decreased appetite: She has been utilizing cannabis and reports her appetite has improved and overall she has felt much better.     DISPO:  -Plan for CT-guided biopsy  -Continue monthly fulvestrant  -FU with MD next month        Interim History:  Karen Hays is a 71 y.o. female who was a previous Dr. Claude Manges patient transferred care to after his Dr. Laney Pastor after his retirement.  Patient returns for follow-up and ongoing management of metastatic breast cancer.    She reports feeling well overall.  She reports intermittent diarrhea which has been overall well controlled with Imodium.  She reports being very compliant with her medication.  Last month she has been feeling down and depressed but has been feeling much better in the last week or so     Does report some  dizziness only when she gets up too quickly, this has been a problem that has predated her cancer diagnosis.  No new headaches or visual changes  She also denies new cough or shortness of breath.        She continues to report pain in her hips and shoulders. She is currently taking oxycodone and report this helps. She reports the pain is not as bad since starting treatment. She denies any new or unusual pain. She does report fatigue that has been ongoing and unchanged. She reports her appetite has been good, she denies wt loss.     Of note:  Ms.Heffern also has a hx of lung cancer; she is s/p resection in August 2021 and was lost to follow-up after that.  Patient reported that she has lost a family member who was like a son to her and this pushed her into severe depression where she stayed at home and would not leave the house.  She followed up in  Feb 2022.         Breast Cancer History  Oncology History   Breast cancer (CMS-HCC)   01/28/2002 Surgery    Right breast lumpectomy: Intraductal carcinoma in situ with extensive intraductal hyperplasia and fibrocystic disease.     03/2002 - 04/2002 Radiation    External beam radiation to the right breast     06/11/2018 Initial Diagnosis    Breast cancer (CMS-HCC): Mammogram: Solid heterogeneous hypervascular mass in the right breast,3.3 x 2.9 x 2.6 cm  cm.  Left breast unremarkable.     06/26/2018 Biopsy     right  breast biopsy: Intermediate grade infiltrating ductal carcinoma, Nottingham 2 (histologic score 3, nuclear score 2, mitotic count 1); ER 95%, PR negative, HER-2 negative, Ki-67 40%  Oncotype Dx 35 (> 15% chemotherapy benefit)     08/26/2018 Biopsy    Left breast Bx: 11:00, 2 cm from nipple, core needle biopsy  -Benign breast tissue with stromal fibrosis        09/08/2018 -  Cancer Staged    CT Chest:  1. Right breast mass consistent with patient's known breast cancer.  2. Right apical masslike pulmonary parenchymal opacities, concerning for adenocarcinoma.  Lung RADS: 4B  3. Bilateral upper lobe predominant groundglass nodular pulmonary parenchymal opacities, indeterminate. Favor infectious/inflammatory (given upper lobe predominance) but can't rule out metastases completely.attention on follow-up.     09/16/2018 -  Cancer Staged    PET CT:  FDG avid spiculated right apical pulmonary mass, which may represent primary lung malignancy versus lung metastasis in the setting of breast cancer. Multiple bilateral subcentimeter pulmonary nodules which demonstrate FDG avidity as above, most compatible with pulmonary metastases.  - Large right FDG avid breast mass, compatible with known diagnosis of breast cancer.  - Increased radiotracer uptake in the left proximal femur, likely osseous metastatic disease.  - Right-sided laryngocele with internal debris.  - Hepatic steatosis.     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     --Small linear T1 hypointense, T2 hyperintense, enhancing focus in the lesser trochanter of the right femur which is nonspecific.     10/09/2018 Biopsy    Bone, femur, left proximal, core biopsy  - Metastatic carcinoma, consistent with patient's known breast carcinoma       10/19/2018 -  Chemotherapy    Letrozole 2.5 mg daily, and palbociclib 125 mg daily 21/28 days     12/28/2018 -  Cancer Staged    CT Chest:  2.6 cm right apical lesion unchanged compared to 09/08/2018; leading diagnostic consideration is for primary lung neoplasm.     Multiple additional pulmonary nodules nodules unchanged compared to 09/08/2018; indeterminate, but favor lung adenocarcinoma spectrum rather than breast cancer metastasis.       08/02/2019 -  Cancer Staged    CT CAP:  Right apical 2.6 cm lesion stable compared to 12/28/2018 and remains concerning for a primary lung neoplasm.  Multiple bilateral nodular opacities are stable compared to prior  NED in AP.         11/09/2021 -  Chemotherapy    OP BREAST FULVESTRANT  fulvestrant 500 mg IM on days 1, 15 on Cycle 1, then day 1 of every cycle thereafter, every 28 days     Malignant neoplasm  metastatic to bone (CMS-HCC)   10/08/2018 Initial Diagnosis    Malignant neoplasm metastatic to bone (CMS-HCC)     11/09/2021 -  Chemotherapy    OP BREAST FULVESTRANT  fulvestrant 500 mg IM on days 1, 15 on Cycle 1, then day 1 of every cycle thereafter, every 28 days     Adenocarcinoma of right lung (CMS-HCC)   02/18/2020 Initial Diagnosis    Adenocarcinoma of right lung (CMS-HCC)     11/09/2021 -  Chemotherapy    OP BREAST FULVESTRANT  fulvestrant 500 mg IM on days 1, 15 on Cycle 1, then day 1 of every cycle thereafter, every 28 days         PMH:  Past Medical History:   Diagnosis Date   ??? Bipolar affective disorder (CMS-HCC)    ??? Breast cancer (CMS-HCC)     right   ??? GERD (gastroesophageal reflux disease)    ??? Hyperlipidemia    ??? Hypertension    ??? Kidney stones        PSH:  Past Surgical History:   Procedure Laterality Date   ??? BREAST BIOPSY     ??? BREAST LUMPECTOMY     ??? PR BX/REMV,LYMPH NODE,DEEP AXILL Right 09/18/2018    Procedure: BX/EXC LYMPH NODE; OPEN, DEEP AXILRY NODE;  Surgeon: Aris Everts, MD;  Location: ASC OR [facility];  Service: Surgical Oncology Breast   ??? PR INTRAOPERATIVE SENTINEL LYMPH NODE ID W DYE INJECTION Right 09/18/2018    Procedure: INTRAOPERATIVE IDENTIFICATION SENTINEL LYMPH NODE(S) INCLUDE INJECTION NON-RADIOACTIVE DYE, WHEN PERFORMED;  Surgeon: Aris Everts, MD;  Location: ASC OR [facility];  Service: Surgical Oncology Breast   ??? PR MASTECTOMY, SIMPLE, COMPLETE Right 09/18/2018    Procedure: MASTECTOMY, SIMPLE, COMPLETE;  Surgeon: Aris Everts, MD;  Location: ASC OR [facility];  Service: Surgical Oncology Breast   ??? PR THORACOSCOPY SURG LOBECTOMY Right 02/18/2020    Procedure: THORACOSCOPY, SURGICAL; WITH LOBECTOMY (SINGLE LOBE);  Surgeon: Evert Kohl, MD;  Location: MAIN OR [facility];  Service: Thoracic   ??? RADIATION         Allergies:  Chantix [varenicline]    Medications:  has a current medication list which includes the following prescription(s): alpelisib, alprazolam, cholecalciferol (vitamin d3-25 mcg (1,000 unit)), furosemide, lamotrigine, letrozole, metformin, metoprolol succinate, nicotine, oxycodone, pantoprazole, prochlorperazine, and simvastatin, and the following Facility-Administered Medications: denosumab.    Personal and Social History:  Social History     Social History Narrative   ??? Not on file       Review of Systems: A complete review of systems was obtained including: Constitutional, Eyes, ENT, Cardiovascular, Respiratory, GI, GU, Musculoskeletal, Skin, Neurological, Psychiatric, Endocrine, Heme/Lymphatic, and Allergic/Immunologic systems. It is negative or non-contributory to the patient???s management except for what is detailed in the Interim History.    This was a tele health appointment so exam was limited.   General: Well appearing with no acute distress visible via video teleconferencing  Neuro: Alert and oriented x 4.  Fluent speech, no dysphasia  Respiratory: Normal Respiratory effort. Able to speak in full sentences without difficulty  Psych: Appropriate mood and affect. Thought content linear and appropriate    PRIOR Physical Exam:  GENERAL: Well appearing female in NAD.  VITAL SIGNS: There were no vitals taken for this visit.  BMI: There is no height or weight on file to calculate BMI.  HEENT: Normocephalic, symmetric. EOMI. Sclera clear. Oropharynx benign.  LYMPH:No cervical, No supraclavicular, No axillary lymphadenopathy.  CHEST/BREASTS: (Deffered today )  Well healed mastectomy incision breast absent, No evidence of locoregional recurrence. Contralateral breast without mass, skin change, retraction or nipple discharge.    LUNGS: respirations even and unlabored.clear to ausculation bilaterally.  CARDIAC: RRR, no murmurs.   ABDOMEN: Soft, nontender, no hepatomegaly or masses  SKIN & SUBCUTANEOUS TISSUES: No/Yes rash, ecchymoses, purpuric lesions noted.  EXTREMITIES: No/Yes upper or lower extremity edema, normal skin tone. No ymphedema noted.  MSK: Spine is nontender.  NEURO: Alert, oriented, normal gait &coordination.    Orders/Results:  No visits with results within 1 Week(s) from this visit.   Latest known visit with results is:   Clinical Support on 03/01/2022   Component Date Value   ??? Creatinine 03/01/2022 0.62    ??? eGFR CKD-EPI (2021) Fema* 03/01/2022 >90    ??? Phosphorus 03/01/2022 4.1    ??? Albumin 03/01/2022 3.9    ??? Calcium 03/01/2022 10.7 (H)      Results for orders placed or performed in visit on 03/01/22   Creatinine   Result Value Ref Range    Creatinine 0.62 0.60 - 0.80 mg/dL    eGFR CKD-EPI (2956) Female >90 >=60 mL/min/1.68m2   Phosphorus Level   Result Value Ref Range    Phosphorus 4.1 2.4 - 5.1 mg/dL   Albumin   Result Value Ref Range    Albumin 3.9 3.4 - 5.0 g/dL   Calcium   Result Value Ref Range    Calcium 10.7 (H) 8.7 - 10.4 mg/dL

## 2022-03-13 ENCOUNTER — Ambulatory Visit: Admit: 2022-03-13 | Discharge: 2022-03-14 | Payer: MEDICARE

## 2022-03-13 DIAGNOSIS — C50911 Malignant neoplasm of unspecified site of right female breast: Principal | ICD-10-CM

## 2022-03-13 DIAGNOSIS — Z170 Estrogen receptor positive status [ER+]: Principal | ICD-10-CM

## 2022-03-13 NOTE — Unmapped (Signed)
It was a pleasure to see you today in the Breast Medical Oncology Clinic.      For clinical concerns during working hours, please call [phone]. My nurse navigator is Barbara Kreitzer, RN    For clinical trial questions please call the study coordinator.     For emergencies, evenings or weekends, please call [phone] and ask for the oncology fellow on call.  Reasons to call the emergency line may include:  - Fever of 100.5 or greater  - Nausea and/or vomiting not relieved with nausea medicine  - Diarrhea or constipation not relieved with bowel regimen  - Severe pain not relieved with usual pain regimen

## 2022-03-13 NOTE — Unmapped (Signed)
[facility] Biopsy Request Information Sheet     Referring Provider:  Jeanie Cooks, MD   Date of Review:  03/13/2022    Reviewing Provider:  Ammie Dalton, MD     Requested Biopsy Site:  liver, right lobe, segment 8    Reason for Request:  evaluation for liver metastasis    Past Medical History:    Past Medical History:   Diagnosis Date    Bipolar affective disorder (CMS-HCC)     Breast cancer (CMS-HCC)     right    GERD (gastroesophageal reflux disease)     Hyperlipidemia     Hypertension     Kidney stones        Imaging reviewed:  I reviewed all pertinent diagnostic studies, including:  CT    Recommended Imaging Modality to Perform Biopsy:  CT    Comments for Biopsy:  CT and US guided targeted liver segment 8 lesion biopsy

## 2022-03-19 ENCOUNTER — Ambulatory Visit: Admit: 2022-03-19 | Discharge: 2022-03-20 | Payer: MEDICARE

## 2022-03-19 DIAGNOSIS — C3411 Malignant neoplasm of upper lobe, right bronchus or lung: Principal | ICD-10-CM

## 2022-03-19 NOTE — Unmapped (Unsigned)
Thoracic Surgery Clinic Follow-up Note      Assessment/Plan:     Karen Hays is a 71 y.o. female with a history of RUL stage 1 adenocarcinoma s/p VATS RUL lobectomy in 2021 and stage IV breast cancer w/ osseous mets. CT chest 02/28/22 w/ stable bilateral nonsolid nodules and now new or enlarging pulmonary nodules. Small nodule in RLL noted on prior imaging in 2020 is stable in size and appearance.     Plan:  Repeat CT chest in 6 months and follow up visit following imaging    Subjective     Karen Hays is a 71 y.o. female w/ a history of RUL stage 1 adenocarcinoma s/p VATS RUL lobectomy in 2021 and stage IV breast cancer w/ osseous mets. She feels well today from a breathing standpoint and denies SOB. Has had some recent weight loss. Denies any new questions or concerns today.        Social History     Tobacco Use   Smoking Status Every Day    Packs/day: 1.00    Years: 50.00    Additional pack years: 0.00    Total pack years: 50.00    Types: Cigarettes   Smokeless Tobacco Never       Objective     Physical Exam:  Vitals:    03/19/22 1102   BP: 145/69   Pulse: 67   Resp: 18   Temp: 36.6 ??C (97.8 ??F)   SpO2: 97%     General: Well-developed, well-nourished female sitting in exam room in no acute distress.  HEENT: Normocephalic, sclera anicteric, EOMI, mucous membranes moist.  Neck: supple, trachea midline  Pulm: Normal work of breathing on room air. No audible wheezes  CVS: Normal rate, regular rhythm. No edema.  GI: Abdomen soft, non-tender, non-distended  MSK: Exremities warm and well-perfused  Neuro: Awake, alert and oriented x3, no focal deficits  Psych: Speech clear and coherent. Mood and affect appropriate    Data Review:  Imaging: Radiology studies were personally reviewed, no new nodules appreciated

## 2022-03-20 NOTE — Unmapped (Signed)
Pharmacist Phone Follow-Up    Cancer Team  Medical Oncology: Dr. Laney Pastor  Reason for call: Oral chemotherapy management  Current treatment: Alpelisib + fulvestrant + denosumab     Assessment/Plan  Metastatic breast cancer:  Karen Hays is a 71 y.o. female with metastatic breast cancer and a PIK3CA mutation.  She started fulvestrant on 11/09/21 and started alpelisib on 11/24/21.  She is currently on metformin 500 mg BID.    Karen Hays is tolerating alpelisib with minimal diarrhea and decreased appetite.  She feels the adverse effects are manageable.     Plan:  Continue alpelisib 300 mg qam with low carb breakfast  Continue metformin 500 mg BID with meals  Continue antihistamine for rash prophylaxis  Continue Imodium prn diarrhea  04/24/22: Labs and provider appointment  04/28/22 :Fulvestrant and Xgeva injections due  ________________________________________________________________________     Interval History   Karen Hays is a 71 y.o. female with metastatic breast cancer who is being treated with alpelisib + fulvestrant + denosumab.      She reports that she is tolerating alpelisb better then when she first started.  She has experienced times with diarrhea.  This past week she states that her diarrhea has been minimal.  She has only needed one dose of Imodium in the past week.  Her appetite has been decreased.      Oral chemotherapy:  Alpelisib 300 mg daily with food  Start date: 11/24/21  Pharmacy: [facility]     Adherence: No missed doses    Adverse Effects:   Appetite - decreased; stable  Fatigue - stable  Diarrhea - improved; Imodium prn works well.    Drug interactions: None identified    Patient verbalized understanding of the above information.     I spent 15 minutes on the phone with the patient on the date of service. I spent an additional 15 minutes on pre- and post-visit activities.     The patient was physically located in [place] or a state in which I am permitted to provide care. The patient and/or parent/guardian understood that s/he may incur co-pays and cost sharing, and agreed to the telemedicine visit. The visit was reasonable and appropriate under the circumstances given the patient's presentation at the time.    The patient and/or parent/guardian has been advised of the potential risks and limitations of this mode of treatment (including, but not limited to, the absence of in-person examination) and has agreed to be treated using telemedicine. The patient's/patient's family's questions regarding telemedicine have been answered.     If the visit was completed in an ambulatory setting, the patient and/or parent/guardian has also been advised to contact their provider???s office for worsening conditions, and seek emergency medical treatment and/or call 911 if the patient deems either necessary.        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     03/2002 - 04/2002 Radiation    External beam radiation to the right breast     06/11/2018 Initial Diagnosis    Breast cancer (CMS-HCC): Mammogram: Solid heterogeneous hypervascular mass in the right breast,3.3 x 2.9 x 2.6 cm  cm.  Left breast unremarkable.     06/26/2018 Biopsy     right breast biopsy: Intermediate grade infiltrating ductal carcinoma, Nottingham 2 (histologic score 3, nuclear score 2, mitotic count 1); ER 95%, PR negative, HER-2 negative, Ki-67  40%  Oncotype Dx 35 (> 15% chemotherapy benefit)     08/26/2018 Biopsy    Left breast Bx: 11:00, 2 cm from nipple, core needle biopsy  -Benign breast tissue with stromal fibrosis        09/08/2018 -  Cancer Staged    CT Chest:  1. Right breast mass consistent with patient's known breast cancer.  2. Right apical masslike pulmonary parenchymal opacities, concerning for adenocarcinoma.  Lung RADS: 4B  3. Bilateral upper lobe predominant groundglass nodular pulmonary parenchymal opacities, indeterminate. Favor infectious/inflammatory (given upper lobe predominance) but can't rule out metastases completely.attention on follow-up.     09/16/2018 -  Cancer Staged    PET CT:  FDG avid spiculated right apical pulmonary mass, which may represent primary lung malignancy versus lung metastasis in the setting of breast cancer. Multiple bilateral subcentimeter pulmonary nodules which demonstrate FDG avidity as above, most compatible with pulmonary metastases.  - Large right FDG avid breast mass, compatible with known diagnosis of breast cancer.  - Increased radiotracer uptake in the left proximal femur, likely osseous metastatic disease.  - Right-sided laryngocele with internal debris.  - Hepatic steatosis.     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     --Small linear T1 hypointense, T2 hyperintense, enhancing focus in the lesser trochanter of the right femur which is nonspecific.     10/09/2018 Biopsy    Bone, femur, left proximal, core biopsy  - Metastatic carcinoma, consistent with patient's known breast carcinoma       10/19/2018 -  Chemotherapy    Letrozole 2.5 mg daily, and palbociclib 125 mg daily 21/28 days     12/28/2018 -  Cancer Staged    CT Chest:  2.6 cm right apical lesion unchanged compared to 09/08/2018; leading diagnostic consideration is for primary lung neoplasm.     Multiple additional pulmonary nodules nodules unchanged compared to 09/08/2018; indeterminate, but favor lung adenocarcinoma spectrum rather than breast cancer metastasis.       08/02/2019 -  Cancer Staged    CT CAP:  Right apical 2.6 cm lesion stable compared to 12/28/2018 and remains concerning for a primary lung neoplasm.  Multiple bilateral nodular opacities are stable compared to prior  NED in AP.         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   Latest known visit with results is:   Clinical Support on 03/01/2022   Component Date Value Ref Range Status    Creatinine 03/01/2022 0.62  0.60 - 0.80 mg/dL Final    eGFR CKD-EPI (2021) Female 03/01/2022 >90  >=60 mL/min/1.66m2 Final    eGFR calculated with CKD-EPI 2021 equation in accordance with [employer] and [employer] of Nephrology Task Force recommendations.    Phosphorus 03/01/2022 4.1  2.4 - 5.1 mg/dL Final    Albumin 02/72/5366 3.9  3.4 - 5.0 g/dL Final    Calcium 44/09/4740 10.7 (H)  8.7 - 10.4 mg/dL Final       Current medications:     Current Outpatient Medications   Medication Sig Dispense Refill alpelisib 300 mg/day (150 mg x 2) Tab Take 2 tablets (300 mg) by mouth daily. 56 tablet 3    ALPRAZolam (XANAX) 0.5 MG tablet Take 1 tablet (0.5 mg total) by mouth.      cholecalciferol, vitamin D3, 25 mcg (1,000 unit) capsule Take 1 capsule (25 mcg total) by mouth.      furosemide (LASIX) 20 MG tablet Take 1 tablet (20 mg total) by mouth two (2) times a day as needed.      lamoTRIgine (LAMICTAL) 100 MG tablet       letrozole (FEMARA) 2.5 mg tablet Take 1 tablet (2.5 mg total) by mouth daily.      metFORMIN (GLUCOPHAGE) 500 MG tablet Take 1 tablet (500 mg total) by mouth in the morning and 1 tablet (500 mg total) in the evening. Take with meals. Start one week prior to starting chemo. 180 tablet 3    metoprolol succinate (TOPROL-XL) 25 MG 24 hr tablet       nicotine (NICODERM CQ) 14 mg/24 hr patch Place 1 patch on the skin daily. 28 patch 0    oxyCODONE (ROXICODONE) 10 mg immediate release tablet TAKE 1 TABLET BY MOUTH EVERY 4 HOURS      pantoprazole (PROTONIX) 40 MG tablet       prochlorperazine (COMPAZINE) 10 MG tablet Take 1 tablet (10 mg total) by mouth every six (6) hours as needed for nausea. 30 tablet 0    simvastatin (ZOCOR) 40 MG tablet        No current facility-administered medications for this visit.     Facility-Administered Medications Ordered in Other Visits   Medication Dose Route Frequency Provider Last Rate Last Admin    denosumab (XGEVA) injection 120 mg  120 mg Subcutaneous Once Jeanie Cooks, MD        loperamide (IMODIUM) capsule 2 mg  2 mg Oral Once Jeanie Cooks, MD

## 2022-03-22 NOTE — Unmapped (Signed)
In-Person L[facility] 1610 (IRB #: [phone]) Informed Consent Documentation    Karen Hays was screened and identified to be a potential participant for [facility] [phone] which is a Breast Cancer Bio-bank Bank overseen by the Principal Investigation Dr. Tomi Hays.     Patient was informed of their potential eligibility for 9819 during a standard of care appointment in surgical oncology with Dr. Oneida Hays and confirmed their interest in participating in the study due to being recently diagnosed with a 1.5 x 1.4 x 1.5 cm hepatic lesion that is highly suspicious for reoccurant breast cancer.     Due to having a highly suspicious mass, indicative of possible malignancy, the patient was asked if she would comfortable having additional cores collected during a diagnostic biopsy for breast cancer research. Patient was amenable to this, and was counseled on self advocating for themselves if they change their mind.     Appropriate blood draws that would occur associated with study were discussed. We reviewed future specimen collection may be requested if there are relevant changes to treatment plan, and that they may decline collection at any point in time.     The details of this breast cancer bio-banking study were discussed with the patient (and/or patient's LAR), including but not limited to: Research is voluntary, and consent can be withdrawn at any time; Study purposes; Possible risks and benefits, including thorough discussion regarding the risks specific to the patient next mostly likely time point of bio-specimen collection was completed; Collection and storage of data and specimens;Time commitment; Contact information for any questions. The patient was given reasonable time to consider participation in the study, in the absence of coercion or undue influence. All questions and concerns were addressed to the satisfaction of the participant (or the participant's LAR) prior to signing the consent document.    Signed Date: 03/19/22    [x]  Patient (or their LAR) signed and dated the [facility] 9819 Consent Form and HIPAA Authorization Form in my presence (IMP).   []  With the assistance of an in-person or telephonic interpreter with IRB approved consent and HIPAA (either in spanish or using short form with additional witness.     [x]  Patient agreed to tissue donation for scheduled upcoming procedures where biospecimen collection is appropriate and agreed upon with the patient.     A signed consent form and HIPAA form was/will be provided to the patient either via paper or using a HIPAA complainant and encrypted e-mail. No study procedures were performed prior to the patient signing the consent forms. Patient verbalized understanding of the information presented. The informed consent, HIPAA Authorization Form, and Eligibility Criteria Form will be placed in the regulatory files in the Office of Clinical & Translational Research as well as scanned into [employer]. Every effort to maintain confidentiality will be employed.     The treating provider was notified of the participant's consent to be enrolled in the study and agrees with enrollment of subject. Eligibility will be confirmed by the treating provider via physical signature of eligibility criteria or co-signing this encounter.     Plan and Notes: Bio-specimens collection will likely taken place during this patient upcoming liver biopsy.     Patient was consent in the presence of training study coordinator Karen Hays, and the patient's sister.     Karen Hays; [facility] 1914 Breast Cancer Biobank Research Coordinator    ________________________________________________________________________________________________________    For purposes of eligibility, this signed and dated document may serve as source (physical signature may  have been obtained prior). By co-signing this encounter, the provider is confirming the patient's eligibility to be consented and enrolled in the [facility] 9819. Protocol Number and Title: [facility] 9819: Correlation of Molecular Markers with Response to Therapy and Breast Cancer Behavior   Protocol Version and Date: Amendment 17, 01/16/2021 Principal Investigator: Dr. Tomi Hays    4.1 Inclusion Criteria:     4.1.1 High suspicion of or known breast cancer (early, recurrent or metastatic)    Noted on most recent progress note from oncology/mammography radiology provider  4.1.2 Lesion accessible for safe biopsy (as deemed by the treating physician).    Noted on most recent notes associated with oncology/mammography radiology provider  4.1.3 Age ? 18 years.    71 y.o. [birth year]   4.1.4 Ability to understand and willingness to sign an informed consent document.   Study details and informed consent document were reviewed with the patient. Patient demonstrated understanding and voluntary participation. The patient was willing and able to give consent per the consenting study coordinator.  4.1.5 If receiving therapeutic anticoagulation; it should be discontinued temporarily prior to biopsy for a length of time to be determined by the study doctor.   Discontinued prior to biopsy, not currently taking, or deemed not of important relevance in relation to the risk associated with research collection during the procedure, and noted on most recent notes associated with oncology/mammography radiology provider.   4.1.6 Adequate marrow function, defined as absolute neutrophil count ? 1.5 x 109/L and platelets ? 100 x 109/L required only for biopsies of vital organs (lung, liver, etc.)   Noted and verified that area of collection is safe for biopsy on most recent notes associated with oncology/mammography radiology provider.   These counts are only required for viral organs. Most recent counts provided :   Lab Results   Component Value Date    NEUTROABS 3.9 03/01/2022        Lab Results   Component Value Date    PLT 195 03/01/2022        4.2 Exclusion Criteria:     4.2.1 Concurrent disease or condition that would make the patient inappropriate for study participation or any serious medical or psychiatric disorder that would interfere with the subject's safety.    Verified by treating investigator at time of encounter, further verified at time of co-signature.  4.2.2 Dementia, altered mental status, or any psychiatric condition that would prohibit the understanding or rendering of informed consent.   Verified by treating investigator at time of encounter that there are no conditions that currently demonstrate the patient in unable to understand and voluntarily participation. Further verified at time of co-signature.  4.2.3 History of serious or life-threatening allergic reaction to local anesthetics (i.e. lidocaine, xylocaine)    No such allergies noted in patient records   Allergies   Allergen Reactions    Chantix [Varenicline] Other (See Comments)     Aggression and irritability, nightmares  Reacted with Lamatrigine     .  4.2.4 Pregnant women are excluded because there may be an increased risk to both mother and fetus in the setting of conscious sedation, which is required for biopsies of certain anatomic sites (e.g. liver, lung). Also, ionizing radiation from CT-guided biopsies may pose a risk to the unborn fetus.    Patient is not pregnant. Noted and verified in progress note from oncology/mammography radiology provider.  4.2.5 Cardiac disease making it unsafe to biopsy in the opinion of the treating physician.    Noted and  verified as not a concern on most recent notes associated with oncology/mammography radiology provider.   4.2.6 If patients receiving bevacizumab or other angiogenesis inhibitors are less than 6 weeks from last dose of the angiogenesis inhibitor, they should not undergo research core biopsies of vital organs (lung, liver, etc.) on this protocol because of the concern for the possibility of increased bleeding risk and delayed healing. Patients receiving bevacizumab or other angiogenesis inhibitors who are undergoing a research biopsy of the breast, peripheral lymph node or a skin punch biopsy must be two weeks from the last dose of the angiogenesis inhibitor.    Noted and verified as not a concern on most recent notes associated with oncology/mammography radiology provider.

## 2022-04-01 NOTE — Unmapped (Signed)
VIR pre procedure prep call completed. Reviewed to [facility] then [facility].  Informed of no show/late cancellation policy. NPO guidelines reviewed. Pt OK to take sips of clear liquids with all AM meds.  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 Pt verbalized understanding. All questions answered.

## 2022-04-02 ENCOUNTER — Ambulatory Visit: Admit: 2022-04-02 | Discharge: 2022-04-02 | Payer: MEDICARE

## 2022-04-02 DIAGNOSIS — C50911 Malignant neoplasm of unspecified site of right female breast: Principal | ICD-10-CM

## 2022-04-02 DIAGNOSIS — Z170 Estrogen receptor positive status [ER+]: Principal | ICD-10-CM

## 2022-04-02 MED ADMIN — fentaNYL (PF) (SUBLIMAZE) injection: INTRAVENOUS | @ 18:00:00 | Stop: 2022-04-02

## 2022-04-02 MED ADMIN — midazolam (VERSED) injection: INTRAVENOUS | @ 18:00:00 | Stop: 2022-04-02

## 2022-04-02 MED ADMIN — lidocaine (XYLOCAINE) 10 mg/mL (1 %) injection: INTRADERMAL | @ 18:00:00 | Stop: 2022-04-02

## 2022-04-02 NOTE — Unmapped (Cosign Needed)
[place] INTERVENTIONAL RADIOLOGY - Pre Procedure H/P  Patient name: Karen Hays  CSN: [id number]  MRN: [id number]  Date of Procedure: @TODAY @      Assessment/Plan:    Karen Hays is a 71 y.o. female who will undergo CT guided biopsy of liver mass in Interventional Radiology.    Consent obtained in the Pre Op holding area by Chauncy Lean, MD.  Risks, benefits, and alternatives including but not limited to bleeding and infection were discussed with patient/patient's representative. All questions were answered to patient/patient's representative satisfaction.  Patient/Patient's representative consents and would like to proceed with the procedure.   --The patient will accept blood products in an emergent situation.  --The patient does not have a Do Not Resuscitate order in effect.      HPI: Karen Hays is a 71 y.o. female hx of stage IV ER+ breast cancer and liver lesions.     Past Medical History:   Diagnosis Date    Bipolar affective disorder (CMS-HCC)     Breast cancer (CMS-HCC)     right    GERD (gastroesophageal reflux disease)     Hyperlipidemia     Hypertension     Kidney stones        Past Surgical History:   Procedure Laterality Date    BREAST BIOPSY      BREAST LUMPECTOMY      PR BX/REMV,LYMPH NODE,DEEP AXILL Right 09/18/2018    Procedure: BX/EXC LYMPH NODE; OPEN, DEEP AXILRY NODE;  Surgeon: Aris Everts, MD;  Location: ASC [facility];  Service: Surgical Oncology Breast    PR INTRAOPERATIVE SENTINEL LYMPH NODE ID W DYE INJECTION Right 09/18/2018    Procedure: INTRAOPERATIVE IDENTIFICATION SENTINEL LYMPH NODE(S) INCLUDE INJECTION NON-RADIOACTIVE DYE, WHEN PERFORMED;  Surgeon: Aris Everts, MD;  [facility];  Service: Surgical Oncology Breast    PR MASTECTOMY, SIMPLE, COMPLETE Right 09/18/2018    Procedure: MASTECTOMY, SIMPLE, COMPLETE;  Surgeon: Aris Everts, MD;  Location: ASC OR [facility];  Service: Surgical Oncology Breast    PR THORACOSCOPY SURG LOBECTOMY Right 02/18/2020    Procedure: THORACOSCOPY, SURGICAL; WITH LOBECTOMY (SINGLE LOBE);  Surgeon: Evert Kohl, MD;  Location: MAIN [facility];  Service: Thoracic    RADIATION          Allergies:   Allergies   Allergen Reactions    Chantix [Varenicline] Other (See Comments)     Aggression and irritability, nightmares  Reacted with Lamatrigine        Medications:  No relevant medications, please see full medication list in Epic.    ASA Grade: ASA 3 - Patient with moderate systemic disease with functional limitations    PE:    Vitals:    04/02/22 1125   Pulse: 67   Resp: 18   Temp: 36.9 ??C (98.4 ??F)   SpO2: 100%     General: female in NAD.  Airway assessment: Class 1 - Can visualize soft palate, fauces, uvula, and tonsillar pillars  Lungs: Respirations nonlabored         Chauncy Lean, MD PGY-3

## 2022-04-02 NOTE — Unmapped (Cosign Needed)
[place] INTERVENTIONAL RADIOLOGY - Operative Note     VIR Post-Procedure Note    Procedure Name: Liver biopsy    Pre-Op Diagnosis: liver mass    Post-Op Diagnosis: Same as pre-operative diagnosis    VIR Providers    Operator: Chauncy Lean, MD and Dr. Nathaneil Canary    Time out: Prior to the procedure, a time out was performed with all team members present. During the time out, the patient, procedure and procedure site when applicable were verbally verified by the team members and Chauncy Lean, MD.    Description of procedure: CT prescan demonstrates the lesion to be increased in size from prior.    [place] with doppler was utilized to identify the lesion. A total of 5 core biopsy samples were obtained Hemostasis achieved via gel slurry.     Sedation:Moderate sedation    Estimated Blood Loss: Minimal    Complications: None    See detailed procedure note with images in PACS [facility]).    The patient tolerated the procedure well without incident or complication and left the room in stable condition.    Chauncy Lean, MD PGY-3

## 2022-04-03 DIAGNOSIS — C7951 Secondary malignant neoplasm of bone: Principal | ICD-10-CM

## 2022-04-03 DIAGNOSIS — C3491 Malignant neoplasm of unspecified part of right bronchus or lung: Principal | ICD-10-CM

## 2022-04-03 DIAGNOSIS — Z170 Estrogen receptor positive status [ER+]: Principal | ICD-10-CM

## 2022-04-03 DIAGNOSIS — C50911 Malignant neoplasm of unspecified site of right female breast: Principal | ICD-10-CM

## 2022-04-03 NOTE — Unmapped (Signed)
VIR post procedure call attempted. No answer.

## 2022-04-05 NOTE — Unmapped (Signed)
[facility] Refill Coordination Note    Specialty Medication(s) to be Shipped:   Hematology/Oncology: Piqray 300mg /day    Other medication(s) to be shipped: No additional medications requested for fill at this time     Karen Hays, DOB: [birth year]  Phone: [phone] (home)       All above HIPAA information was verified with patient.     Was a Nurse, learning disability used for this call? No    Completed refill call assessment today to schedule patient's medication shipment from the [facility] [phone]).  All relevant notes have been reviewed.     Specialty medication(s) and dose(s) confirmed: Regimen is correct and unchanged.   Changes to medications: Aubrielle reports no changes at this time.  Changes to insurance: No  New side effects reported not previously addressed with a pharmacist or physician: None reported  Questions for the pharmacist: No    Confirmed patient received a Conservation officer, historic buildings and a Surveyor, mining with first shipment. The patient will receive a drug information handout for each medication shipped and additional FDA Medication Guides as required.       DISEASE/MEDICATION-SPECIFIC INFORMATION        N/A    SPECIALTY MEDICATION ADHERENCE     Medication Adherence    Patient reported X missed doses in the last month: 0  Specialty Medication: PIQRAY 300 mg/day (150 mg x 2) Tab (alpelisib)  Patient is on additional specialty medications: No  Informant: patient                          Were doses missed due to medication being on hold? No    Piqray 300 mg/day: 10 days of medicine on hand       REFERRAL TO PHARMACIST     Referral to the pharmacist: Not needed      [facility]     Shipping address confirmed in Epic.     Delivery Scheduled: Yes, Expected medication delivery date: 04/10/22.     Medication will be delivered via UPS to the prescription address in Epic WAM.    Jasper Loser   [facility] Specialty Technician

## 2022-04-09 MED FILL — PIQRAY 300 MG/DAY (150 MG X 2) TABLET: ORAL | 28 days supply | Qty: 56 | Fill #1

## 2022-04-21 MED ORDER — LETROZOLE 2.5 MG TABLET
ORAL_TABLET | Freq: Every day | ORAL | 1 refills | 0 days
Start: 2022-04-21 — End: ?

## 2022-04-22 MED ORDER — LETROZOLE 2.5 MG TABLET
ORAL_TABLET | Freq: Every day | ORAL | 1 refills | 90 days | Status: CP
Start: 2022-04-22 — End: ?

## 2022-04-22 NOTE — Unmapped (Signed)
Pt is requesting refill    Most recent clinic visit: 03/13/2022  Next clinic visit:  Visit date not found

## 2022-04-23 NOTE — Unmapped (Unsigned)
Return Visit Note    Patient Name: Karen Hays  Patient Age: 71 y.o.  Encounter Date: 04/24/2022    Referring Physician:   Jeanie Cooks, Md  [address]  [facility]  [place],  [place] [address].     PCP:   Pcp, None Per Patient    Cancer Team  Surgical Oncology: Lucretia Roers, MD  Medical Oncology: Adonis Brook, MD      Reason for visit  Breast breast cancer patient here for reassessment and further management; she was previously a Dr. Claude Manges patient and now under the care of Dr. Laney Pastor.    Diagnosis:   1. 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       She started fulvestrant on 11/09/21 and started alpelisib (300 mg every AM) on 11/24/21.        She initially metformin as instructed 500 mg BID for 3 days followed by 1000 mg BID. However on 5/30 she dropped down to 500 mg BID on her own. She felt that the metformin was causing lost of appetite and diarrhea. Since making this change she reports symptoms have improved. Additionally her BG have remain acceptable. She will continue with the same.      Last staging: August 2023 show stable appearing diffuse osseous metastatic disease with a questionable new focus at the left iliac crest.  CT chest shows stable bilateral nodules with no new or enlarging nodules.  CT abdomen shows stable findings with a questionable interval development of an enhancing 1.5 cm heterogeneous lesion lesion within hepatic segment 8 concerning for new metastatic disease    We had an extensive discussion today concerning the findings on her staging scan.  I explained to the patient that these findings are not definitive for disease progression but somewhat concerning.  She feels clinically well with no signs of clinical disease progression.  We discussed several options including: 1-continuing current therapy with close monitoring of the possible 2 new lesions noted on her recent scans 2-continuing current therapy with plan to perform a biopsy of the liver lesion to confirm metastatic disease.  In the event that the pathology confirms metastatic disease, we will plan to switch to a different line of therapy. 3-proceeding to a different type of treatment approach without the need for biopsy.    Patient opted to proceed with a liver biopsy with plan to switch to a different regimen if biopsy confirms new metastatic disease in liver.  We will arrange for an IR guided biopsy in the next few weeks.  Plan to send that sample for [employer] testing.      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   PARP inhibitor? although BRCA 1 and 2 loss (single copy deletion), consider germline testing if not done  Phase 2 Trial of the Combination of the BET  Inhibitor, [id number] ([id number]), and the PARP Inhibitor Talazoparib, in Patients  with Molecularly-Selected Solid Tumors (ComBET)?        Bone Directed therapy : Xgeva every 8 weeks, next due 04/26/22    2. Primary Lung cancer  -Patient completed right upper lobe lobectomy with lymph node biopsy on 02/18/2020 showing a 2.5 cm tumor, pT1cN0  -She has recovered well and following with Dr. Allayne Gitelman  -CT chest monitoring per Dr. Oneida Alar      3.Smoking addiction  -She has been trying to quit and has tried multiple efforts so far that have failed her.  -declined referral to smoking cessation clinic again today.    Other medical problems include:  -Bipolar disorder  -HTN  -Hx of kindney stones    Supportive care:   Cancer related pain;. She is currently taking oxycodone prn and reports this has been effective. She reports this is well controlled today. Continue to monitor in follow up  Decreased appetite: She has been utilizing cannabis and reports her appetite has improved and overall she has felt much better.     DISPO:  -Plan for CT-guided biopsy  -Continue monthly fulvestrant  -FU with MD next month        Interim History:  Karen Hays is a 71 y.o. female who was a previous Dr. Claude Manges patient transferred care to after his Dr. Laney Pastor after his retirement.  Patient returns for follow-up and ongoing management of metastatic breast cancer.    She reports feeling well overall.  She reports intermittent diarrhea which has been overall well controlled with Imodium.  She reports being very compliant with her medication.  Last month she has been feeling down and depressed but has been feeling much better in the last week or so     Does report some  dizziness only when she gets up too quickly, this has been a problem that has predated her cancer diagnosis.  No new headaches or visual changes  She also denies new cough or shortness of breath.        She continues to report pain in her hips and shoulders. She is currently taking oxycodone and report this helps. She reports the pain is not as bad since starting treatment. She denies any new or unusual pain. She does report fatigue that has been ongoing and unchanged. She reports her appetite has been good, she denies wt loss.     Of note:  Ms.Verhagen also has a hx of lung cancer; she is s/p resection in August 2021 and was lost to follow-up after that.  Patient reported that she has lost a family member who was like a son to her and this pushed her into severe depression where she stayed at home and would not leave the house.  She followed up in  Feb 2022.         Breast Cancer History  Oncology History   Breast cancer (CMS-HCC)   01/28/2002 Surgery    Right breast lumpectomy: Intraductal carcinoma in situ with extensive intraductal hyperplasia and fibrocystic disease.     03/2002 - 04/2002 Radiation    External beam radiation to the right breast     06/11/2018 Initial Diagnosis    Breast cancer (CMS-HCC):  Mammogram: Solid heterogeneous hypervascular mass in the right breast,3.3 x 2.9 x 2.6 cm  cm.  Left breast unremarkable.     06/26/2018 Biopsy     right breast biopsy: Intermediate grade infiltrating ductal carcinoma, Nottingham 2 (histologic score 3, nuclear score 2, mitotic count 1); ER 95%, PR negative, HER-2 negative, Ki-67 40%  Oncotype Dx 35 (> 15% chemotherapy benefit)     08/26/2018 Biopsy    Left breast Bx: 11:00, 2 cm from nipple, core needle biopsy  -Benign breast tissue with stromal fibrosis  ??     09/08/2018 -  Cancer Staged    CT Chest:  1. Right breast mass consistent with patient's known breast cancer.  2. Right apical masslike pulmonary parenchymal opacities, concerning for adenocarcinoma.  Lung RADS: 4B  3. Bilateral upper lobe predominant groundglass nodular pulmonary parenchymal opacities, indeterminate. Favor infectious/inflammatory (given upper lobe predominance) but can't rule out metastases completely.attention on follow-up.     09/16/2018 -  Cancer Staged    PET CT:  FDG avid spiculated right apical pulmonary mass, which may represent primary lung malignancy versus lung metastasis in the setting of breast cancer. Multiple bilateral subcentimeter pulmonary nodules which demonstrate FDG avidity as above, most compatible with pulmonary metastases.  - Large right FDG avid breast mass, compatible with known diagnosis of breast cancer.  - Increased radiotracer uptake in the left proximal femur, likely osseous metastatic disease.  - Right-sided laryngocele with internal debris.  - Hepatic steatosis.     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   Right axilla, sentinel lymph node, biopsy   - Three lymph nodes negative for carcinoma (0/3)     09/28/2018 -  Cancer Staged    Staging form: Breast, AJCC 8th Edition  - Pathologic stage from 09/28/2018: Stage IV (pT2, pN0(sn), cM1, G3, ER+, PR-, HER2-) - Signed by Jeanie Cooks, MD on 02/28/2021       10/08/2018 -  Cancer Staged    MRI LE:  --2.1 x 1.3 x 2.8 cm cortically based lesion in the left femur lesser trochanter and adjacent subtrochanteric shaft which is concerning for metastatic disease.  ??  --Small linear T1 hypointense, T2 hyperintense, enhancing focus in the lesser trochanter of the right femur which is nonspecific.     10/09/2018 Biopsy    Bone, femur, left proximal, core biopsy  - Metastatic carcinoma, consistent with patient's known breast carcinoma       10/19/2018 -  Chemotherapy    Letrozole 2.5 mg daily, and palbociclib 125 mg daily 21/28 days     12/28/2018 -  Cancer Staged    CT Chest:  2.6 cm right apical lesion unchanged compared to 09/08/2018; leading diagnostic consideration is for primary lung neoplasm.     Multiple additional pulmonary nodules nodules unchanged compared to 09/08/2018; indeterminate, but favor lung adenocarcinoma spectrum rather than breast cancer metastasis.       08/02/2019 -  Cancer Staged    CT CAP:  Right apical 2.6 cm lesion stable compared to 12/28/2018 and remains concerning for a primary lung neoplasm.  Multiple bilateral nodular opacities are stable compared to prior  NED in AP.         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 Bipolar affective disorder (CMS-HCC)    ??? Breast cancer (CMS-HCC)     right   ??? Diabetes mellitus (CMS-HCC)    ??? GERD (gastroesophageal reflux disease)    ??? Hyperlipidemia    ??? Hypertension    ??? Kidney stones        PSH:  Past Surgical History:   Procedure Laterality Date   ??? BREAST BIOPSY     ??? BREAST LUMPECTOMY     ??? PR BX/REMV,LYMPH NODE,DEEP AXILL Right 09/18/2018    Procedure: BX/EXC LYMPH NODE; OPEN, DEEP AXILRY NODE;  Surgeon: Aris Everts, MD;  Location: ASC OR [facility];  Service: Surgical Oncology Breast   ??? PR INTRAOPERATIVE SENTINEL LYMPH NODE ID W DYE INJECTION Right 09/18/2018    Procedure: INTRAOPERATIVE IDENTIFICATION SENTINEL LYMPH NODE(S) INCLUDE INJECTION NON-RADIOACTIVE DYE, WHEN PERFORMED;  Surgeon: Aris Everts, MD;  [facility]: ASC OR [facility];  Service: Surgical Oncology Breast   ??? PR MASTECTOMY, SIMPLE, COMPLETE Right 09/18/2018    Procedure: MASTECTOMY, SIMPLE, COMPLETE;  Surgeon: Aris Everts, MD;  Location: ASC OR [facility];  Service: Surgical Oncology Breast   ??? PR THORACOSCOPY SURG LOBECTOMY Right 02/18/2020    Procedure: THORACOSCOPY, SURGICAL; WITH LOBECTOMY (SINGLE LOBE);  Surgeon: Evert Kohl, MD;  Location: MAIN [facility];  Service: Thoracic   ??? RADIATION Allergies:  Chantix [varenicline]    Medications:  has a current medication list which includes the following prescription(s): alpelisib, alprazolam, cholecalciferol (vitamin d3-25 mcg (1,000 unit)), furosemide, lamotrigine, letrozole, metformin, metoprolol succinate, nicotine, oxycodone, pantoprazole, prochlorperazine, and simvastatin, and the following Facility-Administered Medications: denosumab.    Personal and Social History:  Social History     Social History Narrative   ??? Not on file       Review of Systems: A complete review of systems was obtained including: Constitutional, Eyes, ENT, Cardiovascular, Respiratory, GI, GU, Musculoskeletal, Skin, Neurological, Psychiatric, Endocrine, Heme/Lymphatic, and Allergic/Immunologic systems. It is negative or non-contributory to the patient?s management except for what is detailed in the Interim History.    This was a tele health appointment so exam was limited.   General: Well appearing with no acute distress visible via video teleconferencing  Neuro: Alert and oriented x 4.  Fluent speech, no dysphasia  Respiratory: Normal Respiratory effort. Able to speak in full sentences without difficulty  Psych: Appropriate mood and affect. Thought content linear and appropriate    PRIOR Physical Exam:  GENERAL: Well appearing female in NAD.  VITAL SIGNS: There were no vitals taken for this visit.  BMI: There is no height or weight on  file to calculate BMI.  HEENT: Normocephalic, symmetric. EOMI. Sclera clear. Oropharynx benign.  LYMPH:No cervical, No supraclavicular, No axillary lymphadenopathy.  CHEST/BREASTS: (Deffered today ) Well healed mastectomy incision breast absent, No evidence of locoregional recurrence. Contralateral breast without mass, skin change, retraction or nipple discharge.    LUNGS: respirations even and unlabored.clear to ausculation bilaterally.  CARDIAC: RRR, no murmurs.   ABDOMEN: Soft, nontender, no hepatomegaly or masses  SKIN & SUBCUTANEOUS TISSUES: No/Yes rash, ecchymoses, purpuric lesions noted.  EXTREMITIES: No/Yes upper or lower extremity edema, normal skin tone. No ymphedema noted.  MSK: Spine is nontender.  NEURO: Alert, oriented, normal gait &coordination.    Orders/Results:  No visits with results within 1 Week(s) from this visit.   Latest known visit with results is:   Admission on 04/02/2022, Discharged on 04/02/2022   Component Date Value   ??? Addendum 04/02/2022                      Value:This result contains rich text formatting which cannot be displayed here.   ??? Diagnosis 04/02/2022                      Value:This result contains rich text formatting which cannot be displayed here.   ??? Diagnosis Comment 04/02/2022                      Value:This result contains rich text formatting which cannot be displayed here.   ??? Synoptic Report 04/02/2022                      Value:Breast Biomarker Reporting Template                            (Added in Addendum) BREAST: BIOMARKER REPORTING TEMPLATE - A                            Protocol posted: 09/26/2021                                                           Test(s) Performed:                                     Estrogen Receptor (ER) Status:    Positive (greater than 10% of cells demonstrate nuclear positivity)                                    Percentage of Cells with Nuclear Positivity:    95 %                                   Average Intensity of Staining:    Moderate                                  Test Type:    Food and Drug Administration (FDA) cleared (test /  vendor): [employer]                                  Primary Antibody:    SP1                                Test(s) Performed:                                     Progesterone Receptor (PgR) Status:    Negative (less than 1%)                                    :    Internal controls are absent and on slide external controls how appropriate staining                                  Test Type:    Food and Drug Administration (FDA) cleared (test / vendor): [employer]                                  Primary Antibody:    1E2                                Test(s) Performed:                                     HER2 by Immunohistochemistry:    Negative (Score 0)                                  Test Type:    Food and Drug Administration (FDA) cleared (test / vendor): [employer]                                  Primary Antibody:    4B5                                Cold Ischemia and Fixation Times:    Cannot be determined: Cold ischemia time and fixation timoe not provided                                 Testing Performed on Block Number(s):    A1                                                         METHODS  Fixative:    Formalin      ??? Clinical History 04/02/2022                      Value:This result contains rich text formatting which cannot be displayed here.   ??? Gross Description 04/02/2022                      Value:This result contains rich text formatting which cannot be displayed here.   ??? Microscopic Description 04/02/2022                      Value:This result contains rich text formatting which cannot be displayed here.   ??? Disclaimer 04/02/2022                      Value:This result contains rich text formatting which cannot be displayed here.   ??? Glucose, POC 04/02/2022 150      Results for orders placed or performed during the hospital encounter of 04/02/22   Surgical pathology exam   Result Value Ref Range    Addendum       Addendum issued to report breast biomarker studies.      Diagnosis       A: Liver, core needle biopsy  - Metastatic carcinoma involving liver, with an immunoprofile consistent with metastatic carcinoma of breast origin  - See comment    This electronic signature is attestation that the pathologist personally reviewed the submitted material(s) and the final diagnosis reflects that evaluation.      Diagnosis Comment       The patient's clinical history of breast and lung carcinomas is noted.  Following immunostains were performed on tissue block A1 with the results supporting the rendered diagnosis:    GATA3: Strongly and diffusely positive  ER: Strongly and diffusely positive  CK19: Positive  TTF-1: Negative  CDX2: Negative   Arginase 1: Negative    Breast marker studies are being performed and the results will be reported by breast pathology subspecialty in an addendum report.      Synoptic Report       Breast Biomarker Reporting Template   (Added in Addendum) BREAST: BIOMARKER REPORTING TEMPLATE - A   Protocol posted: 09/26/2021         Test(s) Performed:            Estrogen Receptor (ER) Status:    Positive (greater than 10% of cells demonstrate nuclear positivity)           Percentage of Cells with Nuclear Positivity:    95 %          Average Intensity of Staining:    Moderate         Test Type:    Food and Drug Administration (FDA) cleared (test / vendor): [employer]         Primary Antibody:    SP1       Test(s) Performed:            Progesterone Receptor (PgR) Status:    Negative (less than 1%)           :    Internal controls are absent and on slide external controls how appropriate staining         Test Type:    Food and Drug Administration (FDA) cleared (test / vendor): [employer]         Primary Antibody:    1E2  Test(s) Performed:            HER2 by Immunohistochemistry:    Negative (Score 0)         Test Type:    Food and Drug Administration (FDA) cleared (test / vendor): [employer]         Primary Antibody:    4B5       Cold Ischemia and Fixation Times:    Cannot be determined: Cold ischemia time and fixation timoe not provided        Testing Performed on Block Number(s):    A1       METHODS      Fixative:    Formalin       Clinical History       71 year old woman with grade 3 pT2 N0 right breast invasive ductal carcinoma (ER+) and a pulmonary adenocarcinoma, now has a liver mass.      Gross Description       A. Label: Liver       Size: 22 x 2 x 2 mm       Appearance: 2 pale-tan liver cores       A1-A2, respectively, NTR    ([place])      Microscopic Description       Microscopic examination substantiates the above diagnosis.      Disclaimer       Unless otherwise specified, specimens are preserved using 10% neutral buffered formalin. For cases in which immunohistochemical and/or in-situ hybridization stains are performed, the following statement applies: Appropriate controls for each stain (positive controls with or without negative controls) have been evaluated and stain as expected. These stains have not been separately validated for use on decalcified specimens and should be interpreted with caution in that setting. Some of the reagents used for these stains may be classified as analyte specific reagents (ASR). Tests using ASRs were developed, and their performance characteristics were determined, by the Anatomic Pathology Department [facility]). They have not been cleared or approved by the [place] Food and Drug Administration (FDA). The FDA does not require these tests to go through premarket FDA review. These tests are used for clinical purposes. They should not be regarded as investigational or for research. This laboratory is certified under the Clinical Laboratory Improvement Amendments (CLIA) as qualified to perform high complexity clinical laboratory testing.      EMBEDDED IMAGES     POCT Glucose   Result Value Ref Range    Glucose, POC 150 70 - 179 mg/dL

## 2022-04-23 NOTE — Unmapped (Signed)
Clld 1x, left vm per Abdou-its an important appointment to keep because we have treatment decisions to discuss.

## 2022-04-24 ENCOUNTER — Ambulatory Visit: Admit: 2022-04-24 | Payer: MEDICARE

## 2022-04-24 NOTE — Unmapped (Signed)
Call to patient's home phone and mobile phone. Home phone voicemail identifying patient, left vm regarding missed appointment today.    Call to mobile went to voicemail. No confirmation of cell number so did not leave a message.

## 2022-04-29 ENCOUNTER — Ambulatory Visit
Admit: 2022-04-29 | Discharge: 2022-05-02 | Disposition: A | Discharge status: Home / Self Care | Payer: MEDICARE | Admitting: Internal Medicine

## 2022-04-29 ENCOUNTER — Ambulatory Visit: Admit: 2022-04-29 | Discharge: 2022-05-02 | Payer: MEDICARE

## 2022-04-29 ENCOUNTER — Encounter
Admit: 2022-04-29 | Discharge: 2022-05-02 | Disposition: A | Discharge status: Home / Self Care | Payer: MEDICARE | Attending: Internal Medicine | Admitting: Internal Medicine

## 2022-04-29 LAB — CBC W/ AUTO DIFF
BASOPHILS ABSOLUTE COUNT: 0 10*9/L (ref 0.0–0.1)
BASOPHILS ABSOLUTE COUNT: 0 10*9/L (ref 0.0–0.1)
BASOPHILS RELATIVE PERCENT: 0.2 %
BASOPHILS RELATIVE PERCENT: 0.1 %
EOSINOPHILS ABSOLUTE COUNT: 0 10*9/L (ref 0.0–0.5)
EOSINOPHILS ABSOLUTE COUNT: 0 10*9/L (ref 0.0–0.5)
EOSINOPHILS RELATIVE PERCENT: 0 %
EOSINOPHILS RELATIVE PERCENT: 0 %
HEMATOCRIT: 34.7 % (ref 34.0–44.0)
HEMATOCRIT: 33.7 % — ABNORMAL LOW (ref 34.0–44.0)
HEMOGLOBIN: 11.9 g/dL (ref 11.3–14.9)
HEMOGLOBIN: 11.6 g/dL (ref 11.3–14.9)
LYMPHOCYTES ABSOLUTE COUNT: 0.6 10*9/L — ABNORMAL LOW (ref 1.1–3.6)
LYMPHOCYTES ABSOLUTE COUNT: 0.6 10*9/L — ABNORMAL LOW (ref 1.1–3.6)
LYMPHOCYTES RELATIVE PERCENT: 7 %
LYMPHOCYTES RELATIVE PERCENT: 8.2 %
MEAN CORPUSCULAR HEMOGLOBIN CONC: 34.4 g/dL (ref 32.0–36.0)
MEAN CORPUSCULAR HEMOGLOBIN CONC: 34.5 g/dL (ref 32.0–36.0)
MEAN CORPUSCULAR HEMOGLOBIN: 32.5 pg — ABNORMAL HIGH (ref 25.9–32.4)
MEAN CORPUSCULAR HEMOGLOBIN: 32.4 pg (ref 25.9–32.4)
MEAN CORPUSCULAR VOLUME: 94.5 fL (ref 77.6–95.7)
MEAN CORPUSCULAR VOLUME: 94 fL (ref 77.6–95.7)
MEAN PLATELET VOLUME: 8.3 fL (ref 6.8–10.7)
MEAN PLATELET VOLUME: 7.8 fL (ref 6.8–10.7)
MONOCYTES ABSOLUTE COUNT: 0.9 10*9/L — ABNORMAL HIGH (ref 0.3–0.8)
MONOCYTES ABSOLUTE COUNT: 0.8 10*9/L (ref 0.3–0.8)
MONOCYTES RELATIVE PERCENT: 10.8 %
MONOCYTES RELATIVE PERCENT: 11.1 %
NEUTROPHILS ABSOLUTE COUNT: 6.8 10*9/L (ref 1.8–7.8)
NEUTROPHILS ABSOLUTE COUNT: 5.9 10*9/L (ref 1.8–7.8)
NEUTROPHILS RELATIVE PERCENT: 82 %
NEUTROPHILS RELATIVE PERCENT: 80.6 %
NUCLEATED RED BLOOD CELLS: 1 /100{WBCs} (ref <=4)
NUCLEATED RED BLOOD CELLS: 1 /100{WBCs} (ref <=4)
PLATELET COUNT: 200 10*9/L (ref 150–450)
PLATELET COUNT: 186 10*9/L (ref 150–450)
RED BLOOD CELL COUNT: 3.68 10*12/L — ABNORMAL LOW (ref 3.95–5.13)
RED BLOOD CELL COUNT: 3.59 10*12/L — ABNORMAL LOW (ref 3.95–5.13)
RED CELL DISTRIBUTION WIDTH: 14.6 % (ref 12.2–15.2)
RED CELL DISTRIBUTION WIDTH: 14.4 % (ref 12.2–15.2)
WBC ADJUSTED: 8.2 10*9/L (ref 3.6–11.2)
WBC ADJUSTED: 7.4 10*9/L (ref 3.6–11.2)

## 2022-04-29 LAB — SLIDE REVIEW

## 2022-04-29 LAB — COMPREHENSIVE METABOLIC PANEL
ALBUMIN: 3.2 g/dL — ABNORMAL LOW (ref 3.4–5.0)
ALKALINE PHOSPHATASE: 441 U/L — ABNORMAL HIGH (ref 46–116)
ALT (SGPT): 32 U/L (ref 10–49)
ANION GAP: 8 mmol/L (ref 5–14)
AST (SGOT): 303 U/L — ABNORMAL HIGH (ref <=34)
BILIRUBIN TOTAL: 1 mg/dL (ref 0.3–1.2)
BLOOD UREA NITROGEN: 16 mg/dL (ref 9–23)
BUN / CREAT RATIO: 41
CALCIUM: 9.4 mg/dL (ref 8.7–10.4)
CHLORIDE: 97 mmol/L — ABNORMAL LOW (ref 98–107)
CO2: 24.5 mmol/L (ref 20.0–31.0)
CREATININE: 0.39 mg/dL — ABNORMAL LOW
EGFR CKD-EPI (2021) FEMALE: >90 mL/min/{1.73_m2} (ref >=60)
GLUCOSE RANDOM: 119 mg/dL (ref 70–179)
POTASSIUM: 3.9 mmol/L (ref 3.4–4.8)
PROTEIN TOTAL: 6.6 g/dL (ref 5.7–8.2)
SODIUM: 129 mmol/L — ABNORMAL LOW (ref 135–145)

## 2022-04-29 LAB — BASIC METABOLIC PANEL
ANION GAP: 6 mmol/L (ref 5–14)
BLOOD UREA NITROGEN: 17 mg/dL (ref 9–23)
BUN / CREAT RATIO: 45
CALCIUM: 9 mg/dL (ref 8.7–10.4)
CHLORIDE: 98 mmol/L (ref 98–107)
CO2: 25.7 mmol/L (ref 20.0–31.0)
CREATININE: 0.38 mg/dL — ABNORMAL LOW
EGFR CKD-EPI (2021) FEMALE: >90 mL/min/{1.73_m2} (ref >=60)
GLUCOSE RANDOM: 108 mg/dL (ref 70–179)
POTASSIUM: 3.8 mmol/L (ref 3.4–4.8)
SODIUM: 130 mmol/L — ABNORMAL LOW (ref 135–145)

## 2022-04-29 LAB — HEPATIC FUNCTION PANEL
ALBUMIN: 2.9 g/dL — ABNORMAL LOW (ref 3.4–5.0)
ALKALINE PHOSPHATASE: 424 U/L — ABNORMAL HIGH (ref 46–116)
ALT (SGPT): 33 U/L (ref 10–49)
AST (SGOT): 285 U/L — ABNORMAL HIGH (ref <=34)
BILIRUBIN DIRECT: 0.6 mg/dL — ABNORMAL HIGH (ref 0.00–0.30)
BILIRUBIN TOTAL: 1 mg/dL (ref 0.3–1.2)
PROTEIN TOTAL: 6.3 g/dL (ref 5.7–8.2)

## 2022-04-29 LAB — URINALYSIS WITH MICROSCOPY WITH CULTURE REFLEX
BACTERIA: NONE SEEN /HPF
BILIRUBIN UA: NEGATIVE
BLOOD UA: NEGATIVE
GLUCOSE UA: NEGATIVE
KETONES UA: NEGATIVE
LEUKOCYTE ESTERASE UA: NEGATIVE
NITRITE UA: NEGATIVE
PH UA: 6 (ref 5.0–9.0)
PROTEIN UA: 200 — AB
RBC UA: <1 /HPF (ref <=4)
SPECIFIC GRAVITY UA: 1.024 (ref 1.003–1.030)
SQUAMOUS EPITHELIAL: <1 /HPF (ref 0–5)
UROBILINOGEN UA: <2
WBC UA: 1 /HPF (ref 0–5)

## 2022-04-29 LAB — LIPASE: LIPASE: 44 U/L (ref 12–53)

## 2022-04-29 LAB — MAGNESIUM: MAGNESIUM: 1.7 mg/dL (ref 1.6–2.6)

## 2022-04-29 LAB — PHOSPHORUS: PHOSPHORUS: 3.1 mg/dL (ref 2.4–5.1)

## 2022-04-29 MED ADMIN — ondansetron (ZOFRAN) injection 4 mg: 4 mg | INTRAVENOUS | @ 23:00:00 | Stop: 2022-04-29

## 2022-04-29 MED ADMIN — morphine injection 2 mg: 2 mg | INTRAVENOUS | @ 19:00:00 | Stop: 2022-04-29

## 2022-04-29 MED ADMIN — lactated ringers bolus 1,000 mL: 1000 mL | INTRAVENOUS | @ 19:00:00 | Stop: 2022-04-29

## 2022-04-29 MED ADMIN — morphine 4 mg/mL injection 4 mg: 4 mg | INTRAVENOUS | @ 23:00:00 | Stop: 2022-04-29

## 2022-04-29 NOTE — Unmapped (Signed)
Patient presents here with c/o lower abdominal pain and hip pain associated with nausea and vomiting since few days. Denies any fever or chills. Reports pain all over her hip and legs.

## 2022-04-29 NOTE — Unmapped (Signed)
Called patient since she did not show to her visit and we need to discuss new treatment plan and liver biopsy results. No answer

## 2022-04-29 NOTE — Unmapped (Signed)
[facility]  Emergency Department Progress Note    April 29, 2022 3:20 PM    ED care from Dr. Rob Bunting at 3:00 PM. Karen Hays is a 71 y.o. 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 She had a liver biopsy 9/26 prior to worsening of her pain demonstrating hepatic mets and a CT on 9/6 demonstrating a possible new met on the left iliac crest. She has not been tolerating her oral chemotherapy due to her nausea. No recent falls, fever, or chills.    On exam, no abdominal tenderness to palpation. No focal tenderness of BLE. Good reflexes bilaterally. Diffuse lumbar tenderness.Marland Kitchen CBC with mildly decreased RBCs consistent with baseline. CMP with hyponatremia to 129, mild hypoalbuminemia to 3.2, elevated AST to 303, and alkaline phosphatase at 441. Lipase 44. Magnesium 1.7. Phosphorus 3.1.    At time of sign out, pending MRI of C-, T-, and L-spine, UA, LFTs, UA, repeat basic labs.    Medical Decision Making and Progress Notes          Independent Interpretation of Studies: I have independently reviewed MRI spine and note diffuse metastatic lesions.   External Records Reviewed: Patient's most recent outpatient clinic note  Social determinants that significantly affected care: None applicable  History obtained from other sources: None    Additional Progress Notes and Critical Care    6:44 PM   MRI shows metastatic lesions without pathologic fracture or spinal cord compression. On re-evaluation, patient reports uncontrolled pain. Will page [facility] for plan for admission for FTT, uncontrolled pain related to metastatic cancer. .     Portions of this record have been created using Scientist, clinical (histocompatibility and immunogenetics). Dictation errors have been sought, but may not have been identified and corrected.      ED Clinical Impression     Final diagnoses:   Nausea and vomiting, unspecified vomiting type (Primary)   Carcinoma of breast metastatic to bone, unspecified laterality (CMS-HCC)     Documentation assistance was provided by Gus Height, Scribe on April 29, 2022 at 3:21 PM for Shaune Leeks, MD.       May 01, 2022 12:09 PM. Documentation assistance provided by the scribe. I was present during the time the encounter was recorded. The information recorded by the scribe was done at my direction and has been reviewed and validated by me.       Note has been documented by Gus Height on 04/29/2022

## 2022-04-29 NOTE — Unmapped (Signed)
[facility]  Emergency Department Provider Note      ED Clinical Impression      Final diagnoses:   Nausea and vomiting, unspecified vomiting type (Primary)   Carcinoma of breast metastatic to bone, unspecified laterality (CMS-HCC)            Impression, Medical Decision Making, Progress Notes and Critical Care      Impression, Differential Diagnosis and Plan of Care    Patient is a 71 y.o. female with PMH of RUL stage 1 adenocarcinoma s/p VATS RUL lobectomy in 2021, stage IV breast cancer w/ osseous mets, T2DM, HLD, HTN, and GERD presenting for several days of worsening lower abdominal pain with associated nausea and several episodes of NBNB emesis. She additionally notes diffuse pain in the BLE from the hips radiating down the legs.     On exam, the patient is uncomfortable appearing. VS are WNL. Physical exam with no focal tenderness in the legs. No abdominal tenderness. Good reflexes bilaterally. LCTAB. Diffuse lumbar tenderness.     Differential includes Mets to spine, adverse reaction chemotherapy drug.    Plan for labs and UA. Patient has known diffuse osseous metastatic disease. Concerned for possible new metastatic disease to the spine so will also obtain MRI.  Will give morphine and IV fluids for symptom management.       Discussion of Management with other Physicians, QHP or Appropriate Source: N/A   Independent Interpretation of Studies: MRI C/T/L spine detailed above   External Records Reviewed: Patient's most recent outpatient clinic note and Outpatient labs & studies including path report 04/02/2022  Escalation of Care, Consideration of   Social determinants that significantly affected care: None applicable      History obtained from other sources: Husband      Portions of this record have been created using Scientist, clinical (histocompatibility and immunogenetics). Dictation errors have been sought, but may not have been identified and corrected.    See chart and resident provider documentation for details.    ____________________________________________         History        Reason for Visit  Abdominal Pain      HPI   Karen Hays is a 71 y.o. female with PMH of RUL stage 1 adenocarcinoma s/p VATS RUL lobectomy in 2021, stage IV breast cancer w/ osseous mets, T2DM, HLD, HTN, and GERD presenting for evaluation of abdominal pain. Patient reports several days of worsening lower abdominal pain with associated nausea and several episodes of NBNB emesis. She additionally notes diffuse pain in the BLE from the hips radiating down the legs. No recent falls. Of note, patient has known history of stage IV breast cancer with osseous mets. She was last evaluated by heme onc 9/6 who noted the patient has stable appearing diffuse osseous metastatic disease with a questionable new focus at the left iliac crest. She is on Piqray for her breast cancer however states she has not been able to tolerate it for the past several days 2/2 her nausea. She also had a recent biopsy 9/26 that showed new mets in the liver. She states her pain has been worsening since this procedure. Patient denies fever or chills.       Past Medical History:   Diagnosis Date   ??? Bipolar affective disorder (CMS-HCC)    ??? Breast cancer (CMS-HCC)     right   ??? Diabetes mellitus (CMS-HCC)    ??? GERD (gastroesophageal reflux disease)    ???  Hyperlipidemia    ??? Hypertension    ??? Kidney stones        Patient Active Problem List   Diagnosis   ??? Breast cancer (CMS-HCC)   ??? Malignant neoplasm metastatic to bone (CMS-HCC)   ??? Adenocarcinoma of right lung (CMS-HCC)   ??? Nausea & vomiting   ??? Cancer related pain   ??? Failure to thrive in adult   ??? Abdominal pain       Past Surgical History:   Procedure Laterality Date   ??? BREAST BIOPSY     ??? BREAST LUMPECTOMY     ??? PR BX/REMV,LYMPH NODE,DEEP AXILL Right 09/18/2018    Procedure: BX/EXC LYMPH NODE; OPEN, DEEP AXILRY NODE;  Surgeon: Aris Everts, MD;  Location: ASC OR [facility];  Service: Surgical Oncology Breast   ??? PR INTRAOPERATIVE SENTINEL LYMPH NODE ID W DYE INJECTION Right 09/18/2018    Procedure: INTRAOPERATIVE IDENTIFICATION SENTINEL LYMPH NODE(S) INCLUDE INJECTION NON-RADIOACTIVE DYE, WHEN PERFORMED;  Surgeon: Aris Everts, MD;  Location: ASC OR [facility];  Service: Surgical Oncology Breast   ??? PR MASTECTOMY, SIMPLE, COMPLETE Right 09/18/2018    Procedure: MASTECTOMY, SIMPLE, COMPLETE;  Surgeon: Aris Everts, MD;  Location: ASC OR [facility];  Service: Surgical Oncology Breast   ??? PR THORACOSCOPY SURG LOBECTOMY Right 02/18/2020    Procedure: THORACOSCOPY, SURGICAL; WITH LOBECTOMY (SINGLE LOBE);  Surgeon: Evert Kohl, MD;  Location: MAIN OR [facility];  Service: Thoracic   ??? RADIATION         No current facility-administered medications for this encounter.    Current Outpatient Medications:   ???  alpelisib 300 mg/day (150 mg x 2) Tab, Take 2 tablets (300 mg) by mouth daily., Disp: 56 tablet, Rfl: 3  ???  ALPRAZolam (XANAX) 0.5 MG tablet, Take 1 tablet (0.5 mg total) by mouth., Disp: , Rfl:   ???  cholecalciferol, vitamin D3, 25 mcg (1,000 unit) capsule, Take 1 capsule (25 mcg total) by mouth., Disp: , Rfl:   ???  furosemide (LASIX) 20 MG tablet, Take 1 tablet (20 mg total) by mouth two (2) times a day as needed., Disp: , Rfl:   ???  lamoTRIgine (LAMICTAL) 100 MG tablet, , Disp: , Rfl:   ???  letrozole (FEMARA) 2.5 mg tablet, TAKE 1 TABLET BY MOUTH EVERY DAY, Disp: 90 tablet, Rfl: 1  ???  metFORMIN (GLUCOPHAGE) 500 MG tablet, Take 1 tablet (500 mg total) by mouth in the morning and 1 tablet (500 mg total) in the evening. Take with meals. Start one week prior to starting chemo., Disp: 180 tablet, Rfl: 3  ???  metoprolol succinate (TOPROL-XL) 25 MG 24 hr tablet, , Disp: , Rfl:   ???  nicotine (NICODERM CQ) 14 mg/24 hr patch, Place 1 patch on the skin daily., Disp: 28 patch, Rfl: 0  ???  oxyCODONE (ROXICODONE) 10 mg immediate release tablet, TAKE 1 TABLET BY MOUTH EVERY 4 HOURS, Disp: , Rfl:   ???  pantoprazole (PROTONIX) 40 MG tablet, , Disp: , Rfl:   ???  prochlorperazine (COMPAZINE) 10 MG tablet, Take 1 tablet (10 mg total) by mouth every six (6) hours as needed for nausea., Disp: 30 tablet, Rfl: 0  ???  simvastatin (ZOCOR) 40 MG tablet, , Disp: , Rfl:     Facility-Administered Medications Ordered in Other Encounters:   ???  denosumab (XGEVA) injection 120 mg, 120 mg, Subcutaneous, Once, Abdou, Larna Daughters, MD    Allergies  Chantix [varenicline]    Family History   Problem  Relation Age of Onset   ??? Anesthesia problems Neg Hx    ??? Bleeding Disorder Neg Hx        Social History  Social History     Tobacco Use   ??? Smoking status: Every Day     Packs/day: 1.00     Years: 50.00     Additional pack years: 0.00     Total pack years: 50.00     Types: Cigarettes   ??? Smokeless tobacco: Never   Vaping Use   ??? Vaping Use: Former   ??? Substances: Nicotine   Substance Use Topics   ??? Alcohol use: Not Currently   ??? Drug use: Never          Physical Exam     ???     ED Triage Vitals [04/29/22 1304]   Enc Vitals Group      BP 105/68      Heart Rate 93      SpO2 Pulse       Resp 20      Temp 37 ??C (98.6 ??F)      Temp Source Oral      SpO2 96 %      Weight 54.4 kg (120 lb)      Height       Head Circumference       Peak Flow       Pain Score       Pain Loc       Pain Edu?       Excl. in GC?        Constitutional: Alert and oriented. Well appearing and in no distress.  Eyes: Conjunctivae are normal.  ENT       Head: Normocephalic and atraumatic.       Nose: No congestion.       Mouth/Throat: Mucous membranes are moist.       Neck: No stridor.  Hematological/Lymphatic/Immunilogical: No cervical lymphadenopathy.  Cardiovascular: Normal rate, regular rhythm. Normal and symmetric distal pulses are present in all extremities.  Respiratory: Normal respiratory effort. Breath sounds are normal.  Gastrointestinal: Soft and mild diffuse tenderness. There is no CVA tenderness.  Musculoskeletal: Normal range of motion in all extremities.       Right lower leg: No tenderness or edema.       Left lower leg: No tenderness or edema.  Neurologic: Normal speech and language. No gross focal neurologic deficits are appreciate though poor effort and weakness B arms and legs  Skin: Skin is warm, dry and intact. No rash noted.  Psychiatric: Mood and affect are normal. Speech and behavior are normal.       Radiology     MRI Cervical Thoracic Lumbar Spine Wo Contrast   Final Result      Diffuse heterogeneous bone marrow signal, consistent with diffuse metastatic involvement. No evidence of pathologic fracture.      Multilevel degenerative changes of the spine with moderate spinal canal stenosis at C4-5 and C5-C6. No high-grade spinal canal narrowing. Several sites of moderate to severe neuroforaminal narrowing in the lumbar spine.           Procedures              Cherrie Gauze, MD  04/29/22 2139

## 2022-04-30 LAB — COMPREHENSIVE METABOLIC PANEL
ALBUMIN: 2.8 g/dL — ABNORMAL LOW (ref 3.4–5.0)
ALKALINE PHOSPHATASE: 389 U/L — ABNORMAL HIGH (ref 46–116)
ALT (SGPT): 28 U/L (ref 10–49)
ANION GAP: 7 mmol/L (ref 5–14)
AST (SGOT): 190 U/L — ABNORMAL HIGH (ref <=34)
BILIRUBIN TOTAL: 1.4 mg/dL — ABNORMAL HIGH (ref 0.3–1.2)
BLOOD UREA NITROGEN: 15 mg/dL (ref 9–23)
BUN / CREAT RATIO: 35
CALCIUM: 8.1 mg/dL — ABNORMAL LOW (ref 8.7–10.4)
CHLORIDE: 96 mmol/L — ABNORMAL LOW (ref 98–107)
CO2: 26 mmol/L (ref 20.0–31.0)
CREATININE: 0.43 mg/dL — ABNORMAL LOW
EGFR CKD-EPI (2021) FEMALE: >90 mL/min/{1.73_m2} (ref >=60)
GLUCOSE RANDOM: 78 mg/dL (ref 70–179)
POTASSIUM: 3.7 mmol/L (ref 3.4–4.8)
PROTEIN TOTAL: 5.9 g/dL (ref 5.7–8.2)
SODIUM: 129 mmol/L — ABNORMAL LOW (ref 135–145)

## 2022-04-30 LAB — CBC W/ AUTO DIFF
BASOPHILS ABSOLUTE COUNT: 0 10*9/L (ref 0.0–0.1)
BASOPHILS RELATIVE PERCENT: 0.1 %
EOSINOPHILS ABSOLUTE COUNT: 0 10*9/L (ref 0.0–0.5)
EOSINOPHILS RELATIVE PERCENT: 0 %
HEMATOCRIT: 32.3 % — ABNORMAL LOW (ref 34.0–44.0)
HEMOGLOBIN: 11.2 g/dL — ABNORMAL LOW (ref 11.3–14.9)
LYMPHOCYTES ABSOLUTE COUNT: 0.6 10*9/L — ABNORMAL LOW (ref 1.1–3.6)
LYMPHOCYTES RELATIVE PERCENT: 9.1 %
MEAN CORPUSCULAR HEMOGLOBIN CONC: 34.6 g/dL (ref 32.0–36.0)
MEAN CORPUSCULAR HEMOGLOBIN: 32.7 pg — ABNORMAL HIGH (ref 25.9–32.4)
MEAN CORPUSCULAR VOLUME: 94.4 fL (ref 77.6–95.7)
MEAN PLATELET VOLUME: 8 fL (ref 6.8–10.7)
MONOCYTES ABSOLUTE COUNT: 0.9 10*9/L — ABNORMAL HIGH (ref 0.3–0.8)
MONOCYTES RELATIVE PERCENT: 13.1 %
NEUTROPHILS ABSOLUTE COUNT: 5.2 10*9/L (ref 1.8–7.8)
NEUTROPHILS RELATIVE PERCENT: 77.7 %
PLATELET COUNT: 174 10*9/L (ref 150–450)
RED BLOOD CELL COUNT: 3.42 10*12/L — ABNORMAL LOW (ref 3.95–5.13)
RED CELL DISTRIBUTION WIDTH: 14.8 % (ref 12.2–15.2)
WBC ADJUSTED: 6.6 10*9/L (ref 3.6–11.2)

## 2022-04-30 LAB — CANCER ANTIGEN 27.29: CA 27-29: 85.6 U/mL — ABNORMAL HIGH (ref <=38.6)

## 2022-04-30 LAB — MAGNESIUM: MAGNESIUM: 1.6 mg/dL (ref 1.6–2.6)

## 2022-04-30 LAB — PHOSPHORUS: PHOSPHORUS: 2.7 mg/dL (ref 2.4–5.1)

## 2022-04-30 MED ADMIN — HYDROmorphone (PF) (DILAUDID) injection 0.5 mg: .5 mg | INTRAVENOUS | @ 04:00:00 | Stop: 2022-04-30

## 2022-04-30 MED ADMIN — morphine injection 2 mg: 2 mg | INTRAVENOUS | @ 16:00:00 | Stop: 2022-05-14

## 2022-04-30 MED ADMIN — oxyCODONE (ROXICODONE) immediate release tablet 10 mg: 10 mg | ORAL | @ 17:00:00 | Stop: 2022-05-13

## 2022-04-30 MED ADMIN — morphine injection 1 mg: 1 mg | INTRAVENOUS | @ 11:00:00 | Stop: 2022-05-14

## 2022-04-30 MED ADMIN — dexAMETHasone (DECADRON) tablet 4 mg: 4 mg | ORAL | @ 18:00:00

## 2022-04-30 MED ADMIN — morphine injection 2 mg: 2 mg | INTRAVENOUS | @ 22:00:00 | Stop: 2022-05-14

## 2022-04-30 MED ADMIN — ondansetron (ZOFRAN) injection 4 mg: 4 mg | INTRAVENOUS | @ 13:00:00

## 2022-04-30 MED ADMIN — oxyCODONE (ROXICODONE) immediate release tablet 10 mg: 10 mg | ORAL | @ 22:00:00 | Stop: 2022-05-13

## 2022-04-30 MED ADMIN — pantoprazole (PROTONIX) EC tablet 40 mg: 40 mg | ORAL | @ 13:00:00

## 2022-04-30 MED ADMIN — iohexoL (OMNIPAQUE) 350 mg iodine/mL solution 100 mL: 100 mL | INTRAVENOUS | Stop: 2022-04-30

## 2022-04-30 MED ADMIN — morphine injection 1 mg: 1 mg | INTRAVENOUS | @ 06:00:00 | Stop: 2022-05-14

## 2022-04-30 MED ADMIN — letrozole (FEMARA) tablet 2.5 mg: 2.5 mg | ORAL | @ 16:00:00

## 2022-04-30 MED ADMIN — enoxaparin (LOVENOX) syringe 30 mg: 30 mg | SUBCUTANEOUS | @ 13:00:00

## 2022-04-30 MED ADMIN — acetaminophen (TYLENOL) tablet 325 mg: 325 mg | ORAL | @ 16:00:00

## 2022-04-30 NOTE — Unmapped (Signed)
OCCUPATIONAL THERAPY  Evaluation (04/30/22 1447)    Patient Name:  Karen Hays       Medical Record Number: [id number]   Date of Birth: 1951/03/20  Sex: Female            OT Treatment Diagnosis:  Pt with generalized weaknes, back pain, and decreased activity tolerance impacting her performance in ADL routines    Assessment  Problem List: Decreased strength, Impaired balance, Decreased endurance, Impaired ADLs, Fall risk, Decreased mobility  Personal Factors/Comorbidities (Occupational Profile and History Review): Expanded (Moderate)    Assessment of Occupational Performance : Fine or gross motor coordination, Mobility, Strength    Clinical Decision Making: Moderate Complexity    Assessment: Karen Hays is a 71 y.o. female with history of stage IV ER+, PR-, HER2- breast cancer w/ osseous and recently diagnosed liver mets, RUL adenocarcinoma s/p lobectomy (2021), HLD, HTN, and GERD that presented to [facility] with nausea/vomiting, lower back and bone pain and failure to thrive. Pt presents with generalized weakness and limited functional mobility impacting her safe performance in ADL routines. Pt completed sit<>stand with CGA and required MaxA for donning socks. After review of contributing co-mobidities and personal factors, clinical presentation, and exam findings, patient demonstrates moderate complexity for evaluation and development of plan of care. Pt would benefit from 3-4x/week acute OT services and 5x/week low intensity post-acute OT services to promote activity tolerance and increase strength needed for safe functional mobility and participation in ADL routines.    Today's Interventions: Bed mobility, Education - Patient, Functional cognition, Range of motion, UE Strength / coordination exercise      Today's Interventions: Pt participated in occupational interview and ROM/MMT screening while sitting EOB. Completed bed mobility supine>sit and repositioning herself higher in the bed with supervision. MaxA for donning socks, lifting legs with posterior lean to assist therapist. Pt sit<>stand with CGA, standing for ~30 seconds before returning to bedside. Washed face at bed-level with setupA. Pt educated on the role of acute OT, POC, and importance of safe functional mobility (use of BSC, rollator, notify staff for assistance).    Activity Tolerance During Today's Session  Limited by fatigue    Plan  Planned Frequency of Treatment:  1-2x per day for: 3-4x week  Planned Treatment Duration: 05/14/22    Planned Interventions:  Adaptive equipment, ADL retraining, Balance activities, Bed mobility, Education - Patient, Education - Family / caregiver, Conservation, Engineer, water. training, Endurance activities, Functional mobility, Safety education, Therapeutic exercise, Transfer training, UE Strength / coordination exercise    Post-Discharge Occupational Therapy Recommendations:   Skilled OT services indicated, 5x weekly, Low intensity   OT DME Recommendations: Defer to post acute -        GOALS:   Patient and Family Goals: To regain strength and return home    IP Long Term Goal #1: Pt will score 22+/24 on AM-PAC in 6 weeks.       Short Term:  SHORT GOAL #1: Pt will perform LBD with modA.   Time Frame : 2 weeks  SHORT GOAL #2: Pt will perform ~3min seated ADL with SBA.   Time Frame : 2 weeks  SHORT GOAL #3: Pt will perform toilet t/f and toileting with minA + LRAD.   Time Frame : 2 weeks       Prognosis:  Fair  Positive Indicators:     Barriers to Discharge: Pain, Endurance deficits, Inability to safely perform ADLS    Subjective  Current Status Pt received/left semi-reclined  in bed, with call bell in reach, all immediate needs met, and RN aware.  Prior Functional Status Pt reports needing help with everything, only ambulating short distances to/from the bathroom at home with her rollator. Pt takes birdbaths while seated and can dress herself, but needs help with LE management. No reported falls.    Medical Tests / Procedures: Reviewed       Patient / Caregiver reports: I need help with everything    Past Medical History:   Diagnosis Date    Bipolar affective disorder (CMS-HCC)     Breast cancer (CMS-HCC)     right    Diabetes mellitus (CMS-HCC)     GERD (gastroesophageal reflux disease)     Hyperlipidemia     Hypertension     Kidney stones     Social History     Tobacco Use    Smoking status: Every Day     Packs/day: 1.00     Years: 50.00     Additional pack years: 0.00     Total pack years: 50.00     Types: Cigarettes    Smokeless tobacco: Never   Substance Use Topics    Alcohol use: Not Currently      Past Surgical History:   Procedure Laterality Date    BREAST BIOPSY      BREAST LUMPECTOMY      PR BX/REMV,LYMPH NODE,DEEP AXILL Right 09/18/2018    Procedure: BX/EXC LYMPH NODE; OPEN, DEEP AXILRY NODE;  Surgeon: Aris Everts, MD;  Location: ASC OR [facility];  Service: Surgical Oncology Breast    PR INTRAOPERATIVE SENTINEL LYMPH NODE ID W DYE INJECTION Right 09/18/2018    Procedure: INTRAOPERATIVE IDENTIFICATION SENTINEL LYMPH NODE(S) INCLUDE INJECTION NON-RADIOACTIVE DYE, WHEN PERFORMED;  Surgeon: Aris Everts, MD;  Location: ASC OR [facility];  Service: Surgical Oncology Breast    PR MASTECTOMY, SIMPLE, COMPLETE Right 09/18/2018    Procedure: MASTECTOMY, SIMPLE, COMPLETE;  Surgeon: Aris Everts, MD;  Location: ASC OR [facility];  Service: Surgical Oncology Breast    PR THORACOSCOPY SURG LOBECTOMY Right 02/18/2020    Procedure: THORACOSCOPY, SURGICAL; WITH LOBECTOMY (SINGLE LOBE);  Surgeon: Evert Kohl, MD;  Location: MAIN OR [facility];  Service: Thoracic    RADIATION      Family History   Problem Relation Age of Onset    Anesthesia problems Neg Hx     Bleeding Disorder Neg Hx         Chantix [varenicline]     Objective Findings  Precautions / Restrictions  Falls precautions       Weight Bearing  Non-applicable    Required Braces or Orthoses  Non-applicable    Communication Preference Verbal    Pain  Endorsed pain in back, remains the same with movement, did not rate. RN aware    Equipment / Environment  Vascular access (PIV, TLC, Port-a-cath, PICC)    Living Situation  Living Environment: House  Lives With: Spouse  Home Living: Two level home, Able to Live on main level with bedroom/bathroom, Stairs to enter without rails, Walk-in shower  Number of Stairs to Enter (outside): 1  Equipment available at home: Straight cane, Rollator     Cognition   Orientation Level:  Oriented x 4   Arousal/Alertness:  Appropriate responses to stimuli   Attention Span:  Appears intact   Memory:  Appears intact   Following Commands:  Follows all commands and directions without difficulty   Safety Judgment:  Good awareness of safety precautions  Awareness of Errors:  Good awareness of errors made   Problem Solving:  Able to problem solve independently   Comments: Good awareness of limitations and need for assistance with standing and toilet t/f    Vision / Hearing   Vision: No deficits identified     Hearing: No deficit identified         Hand Function:  Right Hand Function: Right hand function impaired  Right Hand Impairment: grip strength fair  Left Hand Function: Left hand function impaired  Left Hand Impairment: grip strength fair    Skin Inspection:  Skin Inspection: Intact where visualized    ROM / Strength:  UE ROM/Strength: Left Impaired/Limited, Right Impaired/Limited  RUE Impairment: Reduced strength  LUE Impairment: Reduced strength  LE ROM/Strength: Left Impaired/Limited, Right Impaired/Limited  RLE Impairment: Reduced strength  LLE Impairment: Reduced strength    Coordination:  Coordination: WFL    Sensation:  RUE Sensation: RUE intact  LUE Sensation: LUE intact  RLE Sensation: RLE intact  LLE Sensation: LLE intact    Balance:  Static Sitting-Balance Support: No upper extremity supported, Feet supported  Static Sitting-Level of Assistance: Close supervision  Dynamic Sitting-Balance Support: No upper extremity supported, Feet supported  Dynamic Sitting-Balance: Posterior lean  Dynamic Sitting-Level of Assistance: Close supervision  Sitting Balance comments: Sat EOB during UE ROM/MMT screening and while therapist donned socks    Static Standing-Balance Support: No upper extremity supported  Static Standing-Level of Assistance: Contact guard  Dynamic Standing-Balance Support: No upper extremity supported  Dynamic Standing - Level of Assistance: Contact guard  Standing Balance comments: Stood EOB with CGA    Functional Mobility  Transfer Assistance Needed: Yes  Transfers - Needs Assistance: Contact Guard assist  Bed Mobility Assistance Needed: Yes  Bed Mobility - Needs Assistance: Supervision  Ambulation: Deferred ambulation this date 2/2 fatigue and some pain    ADLs  ADLs: Needs assistance with ADLs  ADLs - Needs Assistance: LB dressing, UB dressing, Toileting, Bathing  Bathing - Needs Assistance: Performed seated, Mod assist (Anticipate pt would benefit from assistance while bathing to reach LE with limited movement 2/2 weakness)  Toileting - Needs Assistance: Mod assist (BSC, generalized weakness)  UB Dressing - Needs Assistance: Min assist (Anticipate pt would benefit from minA for clothing management 2/2 UE weakness)  LB Dressing - Needs Assistance: Max assist, Performed seated (Therapist donned socks while pt sat EOB, posterior leaning and lifting legs to assist)  IADLs: NT    Vitals / Orthostatics  Vitals/Orthostatics: NAD    Medical Staff Made Aware: RN    Occupational Therapy Session Duration  OT Individual [mins]: 15         I attest that I have reviewed the above information.  Signed: Jaclyn Shaggy, OT  Filed 04/30/2022    The care for this patient was completed by Jaclyn Shaggy, OT:  A student was present and participated in the care. Licensed/Credentialed therapist was physically present and immediately available to direct and supervise tasks that were related to patient management. The direction and supervision was continuous throughout the time these tasks were performed.    Jaclyn Shaggy, OT

## 2022-04-30 NOTE — Unmapped (Signed)
Adult Nutrition Assessment Note    Visit Type: MD Consult  Reason for Visit: Have you gained or lost 10 pounds in the past 3 months?, Have you had a decrease in food intake or appetite?      HPI & PMH:  Karen Hays is a 71 y.o. female with history of stage IV ER+, PR-, HER2- breast cancer w/ osseous and recently diagnosed liver mets, RUL adenocarcinoma s/p lobectomy (2021), HLD, HTN, and GERD that presented to [facility] with nausea/vomiting, lower back and bone pain and failure to thrive.  Past Medical History:   Diagnosis Date   ??? Bipolar affective disorder (CMS-HCC)    ??? Breast cancer (CMS-HCC)     right   ??? Diabetes mellitus (CMS-HCC)    ??? GERD (gastroesophageal reflux disease)    ??? Hyperlipidemia    ??? Hypertension    ??? Kidney stones          Anthropometric Data:  Height:   180.3cm  Admission weight: 54.4 kg (120 lb)  Last recorded weight: 54.4 kg (120 lb)  IBW:  70.4 kg  Percent IBW:  77%  BMI: Body mass index is 16.74 kg/m??.   Usual Body Weight: Unable to obtain at this time     Weight history prior to admission: 18.9% loss since 09/28/21-significant; 24% loss since 06/15/21-significant  Wt Readings from Last 10 Encounters:   04/29/22 54.4 kg (120 lb)   04/02/22 54.4 kg (120 lb)   03/19/22 56.2 kg (123 lb 12.8 oz)   03/13/22 55.5 kg (122 lb 6.4 oz)   03/01/22 57.1 kg (125 lb 14.4 oz)   01/04/22 60.5 kg (133 lb 6.4 oz)   12/07/21 61.4 kg (135 lb 4.8 oz)   11/09/21 65.4 kg (144 lb 1.6 oz)   09/28/21 67.4 kg (148 lb 11.2 oz)   06/15/21 71.8 kg (158 lb 4.8 oz)        Weight changes this admission:   Last 5 Recorded Weights    04/29/22 1304   Weight: 54.4 kg (120 lb)        Nutrition Focused Physical Exam:  Unable to complete at this time due to RD working remotely      NUTRITIONALLY RELEVANT DATA     Medications:   Nutritionally pertinent medications reviewed and evaluated for potential food and/or medication interactions.     Labs:   Nutritionally pertinent labs reviewed.    Lab Results   Component Value Date    NA 129 (L) 04/30/2022    K 3.7 04/30/2022    CL 96 (L) 04/30/2022    CO2 26.0 04/30/2022    BUN 15 04/30/2022    CREATININE 0.43 (L) 04/30/2022    GLU 78 04/30/2022    CALCIUM 8.1 (L) 04/30/2022    ALBUMIN 2.8 (L) 04/30/2022    PHOS 2.7 04/30/2022         Nutrition History:   April 30, 2022: Prior to admission: Phone Visit- Interview Pt  Husband and Pt, reports thinks lost more than 30#'s over 6 months. She eats small amounts, continued this up until 1 week ago, when N/V symptoms started, now oral intake even worse. She was drinking premiere prot. Currently mostly wretching as no oral intake.     Allergies, Intolerances, Sensitivities, and/or Cultural/Religious Dietary Restrictions: none identified at this time     Current Nutrition:  Oral intake          Nutritional Needs:   Healthy balance of carbohydrate, protein, and fat.  Malnutrition Assessment using AND/ASPEN Clinical Characteristics:    Severe Protein-Calorie Malnutrition in the context of chronic illness (04/30/22 1301)  Energy Intake: < or equal to 75% of estimated energy requirement for > or equal to 1 month  Interpretation of Wt. Loss: > 10% x 6 month  Malnutrition Score: 2      GOALS and EVALUATION     ??? Prevent Weight Loss >1% per week - New    Motivation, Barriers, and Compliance:  Evaluation of motivation, barriers, and compliance pending at this time due to clinical status.     NUTRITION ASSESSMENT     ??? Low Na impacted by poor nutrition. Pt nutrition impacted by ongoing chronic low appetite and new onset N/V symptoms. Pt on pain and nausea med regimen. She has had >20% weight loss in less than 1 year. She will likely need consideration for potential appetite stimulating med. Pt is pending GOC discussion with palliative care.       Discharge Planning:   Monitor via CAPP rounds for any discharge planning needs.  Monitor for potential discharge needs with multi-disciplinary team.     Was the nutrition care plan completed? Yes      NUTRITION INTERVENTIONS and RECOMMENDATION     1. Potential consideration dronabinol med for appetite (Medical Staff may review if safe for Pt)  2. Added ensure max BID and boost breeze once daily  3. Intake as tolerable throughout days    Follow-Up Parameters:   1-2 times per week (and more frequent as indicated)    Ed Blalock, MS, RD, CSO, LDN  Pager # [phone]

## 2022-04-30 NOTE — Unmapped (Signed)
Oncology (MEDO) History & Physical    Assessment & Plan:   Karen Hays is a 71 y.o. female with history of stage IV ER+, PR-, HER2- breast cancer w/ osseous and recently diagnosed liver mets, RUL adenocarcinoma s/p lobectomy (2021), HLD, HTN, and GERD that presented to [facility] with nausea/vomiting, lower back and bone pain and failure to thrive.    Principal Problem:    Nausea & vomiting  Active Problems:    Breast cancer (CMS-HCC)    Cancer related pain    Failure to thrive in adult    Abdominal pain  Resolved Problems:    * No resolved hospital problems. *    Active Problems  Stage IV ER+, PR-, HER2- breast cancer - Cancer related pain   Followed by Dr. Laney Pastor. Metastatic to bone and and recently diagnosed liver mets in 9/20230. Currently on fulvestrant-alpelisib 300 mg qam since 11/2021. Also on denosumab (11/02/2018-). Presented to the ED 10/23 with lower back pain radiating to BLE. MRI spine did not reveal spinal cord compression or fractures; known osseous mets with lumbar neuroforaminal narrowing noted. She was planned to switch to different line of therapy given new liver mets pending outpatient follow up. At this time, she interested in hearing about options for additional chemotherapy prior to making a decision regarding continuing treatment versus focusing on comfort.  - Consider palliative care consult in AM for assistance with goals of care and symptom management vs outpatient referral  - Pain control  - Tylenol 325mg  Q6H PRN  - Continue home oxy 10mg  Q4H PRN  - IV morphine 1-2mg  Q3H PRN for breakthrouh pain  - Continue letrozole 2.5mg  daily  - Hold home alpelisib  - Hold home metformin 500mg  BID (prophylaxis against hyperglycemia 2/2 alpelisib)  - Notify Dr. Laney Pastor of admission    Nausea and vomiting - Lower abdominal pain, Elevated LFTs  Presenting with lower abdominal pain over the past few days with associated NBNB emesis. N/V likely secondary to alpelisib given time course. Could consider obstruction secondary to newly diagnosed liver mets given elevated AST, alk phos, mildly elevated bili from baseline. Although she has no abdominal pain on admission; no RUQ pain, fevers or leukocytosis with low concern for infectious etiology of symptoms.  - Consider RUQ [place] if worsening liver function tests or fever  - Zofran prn for nausea    Failure to thrive - Hyponatremia  Over 30 pound weight loss in the last 6 months. Recently missed several oncology appointments due to worsening fatigue. Hyponatremia noted on admission, likely secondary to n/v and poor PO.  - Palliative care consult as above for assistance with goals of care  - S/p LR 1L in ED  - Daily BMP  - PT/OT    Chronic Problems  Bipolar Disorder: Home lamotrigine 100mg , alprazolam 0.5mg  TID PRN  HLD: Home simvastatin  40mg   HTN: Home metoprolol  25mg    GERD: Home pantoprazole 40mg   History of tobacco: Decreased to 1-3 cig/day. Declines nicotine replacement.    The patient's presentation is complicated by the following clinically significant conditions requiring additional evaluation and treatment: - Hypercoagulable state requiring additional attention to DVT prophylaxis and treatment  - Age related debility POA requiring additional resources: DME, PT, or OT  - Hyponatremia POA requiring further investigation, treatment, or monitoring  - Dehydration POA requiring further investigation, treatment, or monitoring  - Metastatic cancer POA requiring further investigation, treatment, or monitoring     Checklist:  Diet: Regular Diet  DVT PPx:  Lovenox 30mg  q24h  Code Status: Full Code  Dispo: Patient appropriate for  ??Inpatient based on expectation of ongoing need for hospitalization greater than two midnights and severity of presentation/services including pain control needing IV medications and goals of care.    Team Contact Information:   Primary Team: Oncology (MEDO)  Primary Resident:   Resident's Pager: [phone] (Oncology Senior Resident)    Chief Concern:   Nausea & vomiting    Subjective:   Karen Hays is a 71 y.o. 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    History obtained from patient.    HPI:  Developed worsening back pain over the past several days. Affecting low back, hips and knees, in similar distribution to prior cancer related pain. Endorses radiation of pain to bilateral legs with movement; no parasthesias, LE weakness or incontinence. She has also noted low abdominal pain recently. She did have a liver biopsy recently in 03/2022, but no RUQ pain. No fevers/chills or diarrhea. No urinary symptoms. She endorses chronic nausea and vomiting since starting alpelisib in 11/2021 but has been slightly worse recently. About 1 episode of emesis per day and unable to keep down pills. She has been unable to tolerate alpelisib over the few days due to nausea.    Regarding cancer history, she was last seen in 03/2022 with recent imaging with new liver lesions and a new focus of left iliac crest. She underwent a liver biopsy on 04/02/22 that revealed confirmed metastatic disease. She was due to follow up with her oncologist last week with plan to switch to different line of therapy given progression of metastatic disease but missed several appointments and team had been unable to contact her. She is currently on Alpelisib and fulvestrant since 11/2021.    In the ED, she was afebrile with stable vitals. MRI total spine was obtained to rule out spinal mets vs compression; imaging did not reveal high grade spinal stenosis or cord compression; imaging re-demonstrated known extensive osseous mets with neuroforaminal narrowing in lumbar spine and degenerative cervical spine changes. Labs were notable for worsening liver function tests AST 303 from 41 on 8/25 and alk phos 441 from 158; t bili 1.0. No leukocytosis. UA was non-infectious.    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     03/2002 - 04/2002 Radiation    External beam radiation to the right breast     06/11/2018 Initial Diagnosis    Breast cancer (CMS-HCC): Mammogram: Solid heterogeneous hypervascular mass in the right breast,3.3 x 2.9 x 2.6 cm  cm.  Left breast unremarkable.     06/26/2018 Biopsy     right breast biopsy: Intermediate grade infiltrating ductal carcinoma, Nottingham 2 (histologic score 3, nuclear score 2, mitotic count 1); ER 95%, PR negative, HER-2 negative, Ki-67 40%  Oncotype Dx 35 (> 15% chemotherapy benefit)     08/26/2018 Biopsy    Left breast Bx: 11:00, 2 cm from nipple, core needle biopsy  -Benign breast tissue with stromal fibrosis        09/08/2018 -  Cancer Staged    CT Chest:  1. Right breast mass consistent with patient's known breast  cancer.  2. Right apical masslike pulmonary parenchymal opacities, concerning for adenocarcinoma.  Lung RADS: 4B  3. Bilateral upper lobe predominant groundglass nodular pulmonary parenchymal opacities, indeterminate. Favor infectious/inflammatory (given upper lobe predominance) but can't rule out metastases completely.attention on follow-up.     09/16/2018 -  Cancer Staged    PET CT:  FDG avid spiculated right apical pulmonary mass, which may represent primary lung malignancy versus lung metastasis in the setting of breast cancer. Multiple bilateral subcentimeter pulmonary nodules which demonstrate FDG avidity as above, most compatible with pulmonary metastases.  - Large right FDG avid breast mass, compatible with known diagnosis of breast cancer.  - Increased radiotracer uptake in the left proximal femur, likely osseous metastatic disease.  - Right-sided laryngocele with internal debris.  - Hepatic steatosis.     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     --Small linear T1 hypointense, T2 hyperintense, enhancing focus in the lesser trochanter of the right femur which is nonspecific.     10/09/2018 Biopsy    Bone, femur, left proximal, core biopsy  - Metastatic carcinoma, consistent with patient's known breast carcinoma       10/19/2018 -  Chemotherapy    Letrozole 2.5 mg daily, and palbociclib 125 mg daily 21/28 days     12/28/2018 -  Cancer Staged    CT Chest:  2.6 cm right apical lesion unchanged compared to 09/08/2018; leading diagnostic consideration is for primary lung neoplasm.     Multiple additional pulmonary nodules nodules unchanged compared to 09/08/2018; indeterminate, but favor lung adenocarcinoma spectrum rather than breast cancer metastasis.       08/02/2019 -  Cancer Staged    CT CAP:  Right apical 2.6 cm lesion stable compared to 12/28/2018 and remains concerning for a primary lung neoplasm.  Multiple bilateral nodular opacities are stable compared to prior  NED in AP.         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)     11/09/2021 -  Chemotherapy    OP BREAST FULVESTRANT  fulvestrant 500 mg IM on days 1, 15 on Cycle 1, then day 1 of every cycle thereafter, every 28 days     Adenocarcinoma of right lung (CMS-HCC)   02/18/2020 Initial Diagnosis    Adenocarcinoma of right lung (CMS-HCC)     11/09/2021 -  Chemotherapy    OP BREAST FULVESTRANT  fulvestrant 500 mg IM on days 1, 15 on Cycle 1, then day 1 of every cycle thereafter, every 28 days       Pertinent Surgical Hx  Past Surgical History:   Procedure Laterality Date    BREAST BIOPSY      BREAST LUMPECTOMY      PR BX/REMV,LYMPH NODE,DEEP AXILL Right 09/18/2018    Procedure: BX/EXC LYMPH NODE; OPEN, DEEP AXILRY NODE;  Surgeon: Aris Everts, MD;  Location: ASC OR [facility];  Service: Surgical Oncology Breast    PR INTRAOPERATIVE SENTINEL LYMPH NODE ID W DYE INJECTION Right 09/18/2018    Procedure: INTRAOPERATIVE IDENTIFICATION SENTINEL LYMPH NODE(S) INCLUDE INJECTION NON-RADIOACTIVE DYE, WHEN PERFORMED;  Surgeon: Aris Everts, MD;  Location: ASC OR [facility];  Service: Surgical Oncology Breast    PR MASTECTOMY, SIMPLE, COMPLETE Right 09/18/2018    Procedure: MASTECTOMY, SIMPLE, COMPLETE;  Surgeon: Aris Everts, MD;  Location: ASC OR [facility];  Service: Surgical Oncology Breast    PR THORACOSCOPY SURG LOBECTOMY Right 02/18/2020    Procedure: THORACOSCOPY, SURGICAL; WITH LOBECTOMY (SINGLE LOBE);  Surgeon: Evert Kohl, MD;  Location: MAIN OR [facility];  Service: Thoracic    RADIATION       Pertinent Family Hx  Family History   Problem Relation Age of Onset    Anesthesia problems Neg Hx     Bleeding Disorder Neg Hx      Pertinent Social Hx   Social Determinants of Health with Concerns     Internet Connectivity: Not on file   Tobacco Use: High Risk (04/29/2022)    Patient History     Smoking Tobacco Use: Every Day     Smokeless Tobacco Use: Never     Passive Exposure: Not on file   Alcohol Use: Not on file   Substance Use: Not on file   Health Literacy: Not on file   Physical Activity: Not on file   Interpersonal Safety: Not on file   Stress: Not on file   Intimate Partner Violence: Not on file   Depression: Not on file   Social Connections: Not on file     Allergies  Chantix [varenicline]    I reviewed the Medication List. The current list is Accurate    Designated Healthcare Decision Maker:  Ms. Loiselle currently has decisional capacity for healthcare decision-making and is able to designate a surrogate healthcare decision maker. Ms. Moad designated healthcare decision maker(s) is/are Camery Decena (the patient's spouse) as denoted by stated patient preference.    Objective:   Physical Exam:  Temp:  [36.9 ??C (98.5 ??F)-37.5 ??C (99.5 ??F)] 37.5 ??C (99.5 ??F)  Heart Rate:  [80-93] 85  SpO2 Pulse:  [82] 82  Resp:  [14-20] 18  BP: (105-125)/(66-75) 116/66  SpO2:  [93 %-96 %] 93 %    Gen: chronically-ill appearing, frail, converses  Eyes: Sclera anicteric, EOMI grossly normal   HENT: Atraumatic, normocephalic  Neck: Trachea midline  Heart: RRR  Lungs: CTAB, no crackles or wheezes  Abdomen: Soft, NTND, no guarding  Extremities: No edema  Neuro: Grossly symmetric, non-focal    Skin:  No rashes, lesions on clothed exam  Psych: Alert, oriented

## 2022-04-30 NOTE — Unmapped (Signed)
PHYSICAL THERAPY  Evaluation (04/30/22 1044)          Patient Name:  Karen Hays       Medical Record Number: [id number]   Date of Birth: 1951/01/02  Sex: Female        Treatment Diagnosis: decreased activity tolerance, diffuse pain     Activity Tolerance: Limited by pain     ASSESSMENT  Problem List: Pain, Decreased endurance, Decreased mobility, Fall risk, Impaired ADLs      Assessment : Ivonne Dorrie Milby is a 71 y.o. female with history of stage IV ER+, PR-, HER2- breast cancer w/ osseous and recently diagnosed liver mets, RUL adenocarcinoma s/p lobectomy (2021), HLD, HTN, and GERD that presented to [facility] with nausea/vomiting, lower back and bone pain and failure to thrive.     She presents to acute PT services with diffuse pain which limits her activity tolerance and participation. Prior to admission she was only walking very short distances with a rollator (only to/from bathroom) due to pain and fatigue. Her Husband was assisting with all ADLs. Today she performed supine <> sit mod IND but declined further mobility due to pain despite encouragement. Pt will benefit from ongoing acute PT services to further assess mobility; post acute recs are TBD pending out of bed mobility assessment. After a review of the personal factors, comorbidities, clinical presentation, and examination of the number of affected body systems, the patient presents as a moderate complexity case.      Today's Interventions: PT Eval, pt education re; PT role, POC, importance of mobility, staff assist, OOB to chair                        PLAN  Planned Frequency of Treatment:  1-2x per day for: 3-4x week       Planned Interventions: Balance activities, Education - Patient, Education - Family / caregiver, Endurance activities, Functional mobility, Investment banker, operational, Self-care / Home training, Stair training, Teacher, early years/pre, Photographer, Therapeutic activity, Therapeutic exercise     Post-Discharge Physical Therapy Recommendations:  PT Post Acute Discharge Recommendations: To be determined (pending participation in out of bed mobility assessment)      PT DME Recommendations: Defer to post acute            Goals:   Patient and Family Goals: none stated today     Long Term Goal #1: TBD pending OOB mobility assessment.        SHORT GOAL #1: Pt will participate in OOB mobility assessment.               Time Frame : 2 weeks                                                                                Prognosis:  Fair  Positive Indicators: age  Barriers to Discharge: Pain, Endurance deficits     SUBJECTIVE  Patient reports: Pt reported being unable to get comfortable, tired.  Current Functional Status: session began and ended with pt in bed, needs in reach     Prior Functional Status: Pt reports minimal ambulation at home,  primarily only to/from bathroom using  a rollator. No falls. Husband has been assisting with everything  Equipment available at home: Straight cane, Rollator      Past Medical History:   Diagnosis Date    Bipolar affective disorder (CMS-HCC)     Breast cancer (CMS-HCC)     right    Diabetes mellitus (CMS-HCC)     GERD (gastroesophageal reflux disease)     Hyperlipidemia     Hypertension     Kidney stones             Social History     Tobacco Use    Smoking status: Every Day     Packs/day: 1.00     Years: 50.00     Additional pack years: 0.00     Total pack years: 50.00     Types: Cigarettes    Smokeless tobacco: Never   Substance Use Topics    Alcohol use: Not Currently       Past Surgical History:   Procedure Laterality Date    BREAST BIOPSY      BREAST LUMPECTOMY      PR BX/REMV,LYMPH NODE,DEEP AXILL Right 09/18/2018    Procedure: BX/EXC LYMPH NODE; OPEN, DEEP AXILRY NODE;  Surgeon: Aris Everts, MD;  Location: ASC OR [facility];  Service: Surgical Oncology Breast    PR INTRAOPERATIVE SENTINEL LYMPH NODE ID W DYE INJECTION Right 09/18/2018    Procedure: INTRAOPERATIVE IDENTIFICATION SENTINEL LYMPH NODE(S) INCLUDE INJECTION NON-RADIOACTIVE DYE, WHEN PERFORMED;  Surgeon: Aris Everts, MD;  Location: ASC OR [facility];  Service: Surgical Oncology Breast    PR MASTECTOMY, SIMPLE, COMPLETE Right 09/18/2018    Procedure: MASTECTOMY, SIMPLE, COMPLETE;  Surgeon: Aris Everts, MD;  Location: [facility] OR [facility];  Service: Surgical Oncology Breast    PR THORACOSCOPY SURG LOBECTOMY Right 02/18/2020    Procedure: THORACOSCOPY, SURGICAL; WITH LOBECTOMY (SINGLE LOBE);  Surgeon: Evert Kohl, MD;  Location: MAIN OR [facility];  Service: Thoracic    RADIATION               Family History   Problem Relation Age of Onset    Anesthesia problems Neg Hx     Bleeding Disorder Neg Hx         Allergies: Chantix [varenicline]                  Objective Findings  Precautions / Restrictions  Precautions: Falls precautions  Weight Bearing Status: Non-applicable  Required Braces or Orthoses: Non-applicable     Communication Preference: Verbal          Pain Comments: reported pain all over but did not rate due to everyone asking the same question.  Medical Tests / Procedures: Diffuse heterogeneous bone marrow signal, consistent with diffuse metastatic involvement. No evidence of pathologic fracture.    Multilevel degenerative changes of the spine with moderate spinal canal stenosis at C4-5 and C5-C6. No high-grade spinal canal narrowing. Several sites of moderate to severe neuroforaminal narrowing in the lumbar spine.  Equipment / Environment: Vascular access (PIV, TLC, Port-a-cath, PICC)     Vitals/Orthostatics : VSS     Living Situation  Living Environment: House  Lives With: Spouse  Home Living: Two level home, Able to Live on main level with bedroom/bathroom, Stairs to enter without rails, Walk-in shower  Number of Stairs to Enter (outside): 1      Cognition: Impaired/Limited  Cognition comment: pt having difficulty focusing 2/2 pain  Upper Extremities  UE Strength: Right WFL, Left WFL    Lower Extremities  LE Strength: Left WFL, Right WFL     Sensation: WFL    Static Sitting-Level of Assistance: Close supervision           Bed Mobility: Supine to Sit  Supine to Sit assistance level: Modified independent, requires aide device or extra time  Bed Mobility: supine <> sit mod IND, extra time 2/2 pain. also rolling in bed IND.     Transfer comments: pt refused 2/2 pain      Gait: pt refused     Stairs: not assessed            Endurance: poor     Physical Therapy Session Duration  PT Individual [mins]: 15     Medical Staff Made Aware: RN cleared pt for PT     I attest that I have reviewed the above information.  Signed: Salome Holmes, PT  Filed 04/30/2022

## 2022-05-01 ENCOUNTER — Ambulatory Visit: Admit: 2022-05-01 | Discharge: 2022-05-07 | Payer: MEDICARE

## 2022-05-01 ENCOUNTER — Ambulatory Visit: Admit: 2022-05-01 | Discharge: 2022-05-02 | Payer: MEDICARE | Attending: Internal Medicine | Primary: Internal Medicine

## 2022-05-01 DIAGNOSIS — C7951 Secondary malignant neoplasm of bone: Principal | ICD-10-CM

## 2022-05-01 LAB — BASIC METABOLIC PANEL
ANION GAP: 5 mmol/L (ref 5–14)
BLOOD UREA NITROGEN: 20 mg/dL (ref 9–23)
BUN / CREAT RATIO: 44
CALCIUM: 8 mg/dL — ABNORMAL LOW (ref 8.7–10.4)
CHLORIDE: 94 mmol/L — ABNORMAL LOW (ref 98–107)
CO2: 26 mmol/L (ref 20.0–31.0)
CREATININE: 0.45 mg/dL — ABNORMAL LOW
EGFR CKD-EPI (2021) FEMALE: >90 mL/min/{1.73_m2} (ref >=60)
GLUCOSE RANDOM: 168 mg/dL (ref 70–179)
POTASSIUM: 3.7 mmol/L (ref 3.4–4.8)
SODIUM: 125 mmol/L — ABNORMAL LOW (ref 135–145)

## 2022-05-01 LAB — COMPREHENSIVE METABOLIC PANEL
ALBUMIN: 2.5 g/dL — ABNORMAL LOW (ref 3.4–5.0)
ALKALINE PHOSPHATASE: 333 U/L — ABNORMAL HIGH (ref 46–116)
ALT (SGPT): 23 U/L (ref 10–49)
ANION GAP: 8 mmol/L (ref 5–14)
AST (SGOT): 86 U/L — ABNORMAL HIGH (ref <=34)
BILIRUBIN TOTAL: 1 mg/dL (ref 0.3–1.2)
BLOOD UREA NITROGEN: 22 mg/dL (ref 9–23)
BUN / CREAT RATIO: 45
CALCIUM: 8 mg/dL — ABNORMAL LOW (ref 8.7–10.4)
CHLORIDE: 94 mmol/L — ABNORMAL LOW (ref 98–107)
CO2: 25 mmol/L (ref 20.0–31.0)
CREATININE: 0.49 mg/dL — ABNORMAL LOW
EGFR CKD-EPI (2021) FEMALE: >90 mL/min/{1.73_m2} (ref >=60)
GLUCOSE RANDOM: 198 mg/dL — ABNORMAL HIGH (ref 70–179)
POTASSIUM: 4 mmol/L (ref 3.4–4.8)
PROTEIN TOTAL: 6 g/dL (ref 5.7–8.2)
SODIUM: 127 mmol/L — ABNORMAL LOW (ref 135–145)

## 2022-05-01 LAB — CBC W/ AUTO DIFF
BASOPHILS ABSOLUTE COUNT: 0 10*9/L (ref 0.0–0.1)
BASOPHILS RELATIVE PERCENT: 0.1 %
EOSINOPHILS ABSOLUTE COUNT: 0 10*9/L (ref 0.0–0.5)
EOSINOPHILS RELATIVE PERCENT: 0 %
HEMATOCRIT: 31.3 % — ABNORMAL LOW (ref 34.0–44.0)
HEMOGLOBIN: 11 g/dL — ABNORMAL LOW (ref 11.3–14.9)
LYMPHOCYTES ABSOLUTE COUNT: 0.3 10*9/L — ABNORMAL LOW (ref 1.1–3.6)
LYMPHOCYTES RELATIVE PERCENT: 5.6 %
MEAN CORPUSCULAR HEMOGLOBIN CONC: 35.2 g/dL (ref 32.0–36.0)
MEAN CORPUSCULAR HEMOGLOBIN: 33 pg — ABNORMAL HIGH (ref 25.9–32.4)
MEAN CORPUSCULAR VOLUME: 93.8 fL (ref 77.6–95.7)
MEAN PLATELET VOLUME: 8 fL (ref 6.8–10.7)
MONOCYTES ABSOLUTE COUNT: 0.5 10*9/L (ref 0.3–0.8)
MONOCYTES RELATIVE PERCENT: 9.7 %
NEUTROPHILS ABSOLUTE COUNT: 4.6 10*9/L (ref 1.8–7.8)
NEUTROPHILS RELATIVE PERCENT: 84.6 %
PLATELET COUNT: 188 10*9/L (ref 150–450)
RED BLOOD CELL COUNT: 3.34 10*12/L — ABNORMAL LOW (ref 3.95–5.13)
RED CELL DISTRIBUTION WIDTH: 14.7 % (ref 12.2–15.2)
WBC ADJUSTED: 5.4 10*9/L (ref 3.6–11.2)

## 2022-05-01 LAB — MAGNESIUM: MAGNESIUM: 1.9 mg/dL (ref 1.6–2.6)

## 2022-05-01 LAB — SODIUM: SODIUM: 127 mmol/L — ABNORMAL LOW (ref 135–145)

## 2022-05-01 LAB — PHOSPHORUS: PHOSPHORUS: 2.7 mg/dL (ref 2.4–5.1)

## 2022-05-01 LAB — OSMOLALITY, SERUM: OSMOLALITY MEASURED: 275 mosm/kg (ref 275–295)

## 2022-05-01 MED ORDER — CAPECITABINE 500 MG TABLET
ORAL_TABLET | Freq: Two times a day (BID) | ORAL | 6 refills | 14 days | Status: CP
Start: 2022-05-01 — End: ?

## 2022-05-01 MED ADMIN — dexAMETHasone (DECADRON) tablet 4 mg: 4 mg | ORAL | @ 13:00:00

## 2022-05-01 MED ADMIN — enoxaparin (LOVENOX) syringe 30 mg: 30 mg | SUBCUTANEOUS | @ 14:00:00

## 2022-05-01 MED ADMIN — metoPROLOL succinate (Toprol-XL) 24 hr tablet 25 mg: 25 mg | ORAL | @ 14:00:00

## 2022-05-01 MED ADMIN — lamoTRIgine (LaMICtal) tablet 100 mg: 100 mg | ORAL | @ 01:00:00

## 2022-05-01 MED ADMIN — letrozole (FEMARA) tablet 2.5 mg: 2.5 mg | ORAL | @ 14:00:00 | Stop: 2022-05-01

## 2022-05-01 MED ADMIN — pravastatin (PRAVACHOL) tablet 80 mg: 80 mg | ORAL | @ 01:00:00

## 2022-05-01 MED ADMIN — morphine injection 2 mg: 2 mg | INTRAVENOUS | @ 08:00:00 | Stop: 2022-05-14

## 2022-05-01 MED ADMIN — sodium chloride 0.9% (NS) bolus 750 mL: 750 mL | INTRAVENOUS | @ 16:00:00 | Stop: 2022-05-01

## 2022-05-01 MED ADMIN — pantoprazole (PROTONIX) EC tablet 40 mg: 40 mg | ORAL | @ 14:00:00

## 2022-05-01 MED ADMIN — oxyCODONE (ROXICODONE) immediate release tablet 10 mg: 10 mg | ORAL | @ 19:00:00 | Stop: 2022-05-13

## 2022-05-01 MED ADMIN — oxyCODONE (ROXICODONE) immediate release tablet 10 mg: 10 mg | ORAL | @ 08:00:00 | Stop: 2022-05-13

## 2022-05-01 NOTE — Unmapped (Signed)
Pt alert and orient x4. Irritable at times. Voiding on BSC, urine very dark. Taking fluids well but only ate crackers, zofran given x1. Med for pain 10/10 with oxy and morphine with good results. Heating pad provided for comfort. Husband at bedside.     Problem: Adult Inpatient Plan of Care  Goal: Plan of Care Review  Outcome: Ongoing - Unchanged  Goal: Patient-Specific Goal (Individualized)  Outcome: Ongoing - Unchanged  Goal: Absence of Hospital-Acquired Illness or Injury  Outcome: Ongoing - Unchanged  Intervention: Identify and Manage Fall Risk  Recent Flowsheet Documentation  Taken 04/30/2022 1400 by Linna Darner, RN  Safety Interventions:   commode/urinal/bedpan at bedside   fall reduction program maintained   nonskid shoes/slippers when out of bed  Taken 04/30/2022 1200 by Linna Darner, RN  Safety Interventions:   commode/urinal/bedpan at bedside   fall reduction program maintained   nonskid shoes/slippers when out of bed  Taken 04/30/2022 1000 by Linna Darner, RN  Safety Interventions:   commode/urinal/bedpan at bedside   fall reduction program maintained   nonskid shoes/slippers when out of bed  Taken 04/30/2022 0844 by Linna Darner, RN  Safety Interventions:   commode/urinal/bedpan at bedside   fall reduction program maintained   nonskid shoes/slippers when out of bed  Intervention: Prevent and Manage VTE (Venous Thromboembolism) Risk  Recent Flowsheet Documentation  Taken 04/30/2022 1400 by Linna Darner, RN  Activity Management:   activity adjusted per tolerance   up to bedside commode  Taken 04/30/2022 1200 by Linna Darner, RN  Activity Management:   activity adjusted per tolerance   up to bedside commode  Taken 04/30/2022 1000 by Linna Darner, RN  Activity Management:   activity adjusted per tolerance   up to bedside commode  Taken 04/30/2022 0844 by Linna Darner, RN  Activity Management:   activity adjusted per tolerance   up to bedside commode  Goal: Optimal Comfort and Wellbeing  Outcome: Ongoing - Unchanged  Goal: Readiness for Transition of Care  Outcome: Ongoing - Unchanged  Goal: Rounds/Family Conference  Outcome: Ongoing - Unchanged     Problem: Fall Injury Risk  Goal: Absence of Fall and Fall-Related Injury  Outcome: Ongoing - Unchanged  Intervention: Promote Injury-Free Environment  Recent Flowsheet Documentation  Taken 04/30/2022 1400 by Linna Darner, RN  Safety Interventions:   commode/urinal/bedpan at bedside   fall reduction program maintained   nonskid shoes/slippers when out of bed  Taken 04/30/2022 1200 by Linna Darner, RN  Safety Interventions:   commode/urinal/bedpan at bedside   fall reduction program maintained   nonskid shoes/slippers when out of bed  Taken 04/30/2022 1000 by Linna Darner, RN  Safety Interventions:   commode/urinal/bedpan at bedside   fall reduction program maintained   nonskid shoes/slippers when out of bed  Taken 04/30/2022 0844 by Linna Darner, RN  Safety Interventions:   commode/urinal/bedpan at bedside   fall reduction program maintained   nonskid shoes/slippers when out of bed     Problem: Self-Care Deficit  Goal: Improved Ability to Complete Activities of Daily Living  Outcome: Ongoing - Unchanged     Problem: Impaired Wound Healing  Goal: Optimal Wound Healing  Outcome: Ongoing - Unchanged  Intervention: Promote Wound Healing  Recent Flowsheet Documentation  Taken 04/30/2022 1400 by Linna Darner, RN  Activity Management:   activity adjusted per tolerance   up to bedside commode  Taken 04/30/2022 1200 by Linna Darner, RN  Activity Management:   activity adjusted per tolerance   up to bedside commode  Taken 04/30/2022 1000 by Linna Darner, RN  Activity Management:   activity adjusted per tolerance   up to bedside commode  Taken 04/30/2022 0844 by Linna Darner, RN  Activity Management:   activity adjusted per tolerance   up to bedside commode     Problem: Malnutrition  Goal: Improved Nutritional Intake  Outcome: Ongoing - Unchanged

## 2022-05-01 NOTE — Unmapped (Signed)
Pt plan of care discussed, verbalizes understanding. For radiation this afternoon, pain management monitored. Tolerates meals ok.  Problem: Adult Inpatient Plan of Care  Goal: Plan of Care Review  Outcome: Ongoing - Unchanged  Goal: Patient-Specific Goal (Individualized)  Outcome: Ongoing - Unchanged  Goal: Absence of Hospital-Acquired Illness or Injury  Outcome: Ongoing - Unchanged  Goal: Optimal Comfort and Wellbeing  Outcome: Ongoing - Unchanged  Goal: Readiness for Transition of Care  Outcome: Ongoing - Unchanged  Goal: Rounds/Family Conference  Outcome: Ongoing - Unchanged     Problem: Fall Injury Risk  Goal: Absence of Fall and Fall-Related Injury  Outcome: Ongoing - Unchanged     Problem: Self-Care Deficit  Goal: Improved Ability to Complete Activities of Daily Living  Outcome: Ongoing - Unchanged

## 2022-05-01 NOTE — Unmapped (Signed)
Care Management  Initial Transition Planning Assessment      Type of Residence: Mailing Address:  [address]  [place] [address]  Contacts: Password: Shadow  Patient Phone Number: [phone] (mobile)    Medical Provider(s): Marlowe Kays, MD    Reason for Admission: Admitting Diagnosis:  Carcinoma of breast metastatic to bone, unspecified laterality (CMS-HCC) [C50.919, C79.51]  Nausea and vomiting, unspecified vomiting type [R11.2]    Past Medical History:   has a past medical history of Bipolar affective disorder (CMS-HCC), Breast cancer (CMS-HCC), Diabetes mellitus (CMS-HCC), GERD (gastroesophageal reflux disease), Hyperlipidemia, Hypertension, and Kidney stones.  Past Surgical History:   has a past surgical history that includes Breast biopsy; Radiation; Breast lumpectomy; pr mastectomy, simple, complete (Right, 09/18/2018); pr bx/remv,lymph node,deep axill (Right, 09/18/2018); pr intraoperative sentinel lymph node id w dye injection (Right, 09/18/2018); and pr thoracoscopy surg lobectomy (Right, 02/18/2020).   Previous admit date: 02/18/2020    Primary Insurance- Payor: BCBS MEDICARE ADV / Plan: BLUE MEDICARE ADV HMO/PPO / Product Type: *No Product type* /   Secondary Insurance - None  Prescription Coverage - yes  Preferred Pharmacy - [facility] - [place], [place] - [address]  [id number] - [place], [place] - [address]  [facility]  [facility]    Transportation home: Private vehicle              General  Care Manager assessed the patient by : In person interview with patient, Medical record review, Discussion with Clinical Care team  Orientation Level: Oriented X4  Functional level prior to admission: Independent  Reason for referral: Discharge Planning    Contact/Decision Maker  Extended Emergency Contact Information  Primary Emergency Contact: BOWLES,JENNIFER- DTR  Mobile Phone: [phone]  Secondary Emergency Contact: Woodrome,JEFFREY  Home Phone: [phone]  Mobile Phone: [phone]  Relation: Spouse    Legal Next of Kin / Guardian / POA / Advance Directives     HCDM (HCPOA): AAMINAH, WIEBENGA - [phone]    HCDM, First Alternate: Milam,Jamie - Son - [phone]    HCDM, Second AlternateLowella Fairy - Daughter - [phone]    Advance Directive (Medical Treatment)  Does patient have an advance directive covering medical treatment?: Patient has advance directive covering medical treatment, copy not in chart.  Advance directive covering medical treatment not in Chart:: Copy requested from family  Reason patient does not have an advance directive covering medical treatment:: Patient does not wish to complete one at this time.    Health Care Decision Maker [HCDM] (Medical & Mental Health Treatment)  Healthcare Decision Maker: HCDM documented in the HCDM/Contact Info section.  Information offered on HCDM, Medical & Mental Health advance directives:: Patient declined information.     Readmission Information    Have you been hospitalized in the last 30 days?: No     Did the following happen with your discharge?     Patient Information  Lives with: Spouse/significant other    Type of Residence: Private residence     Support Systems/Concerns: Children, Spouse, Family Members    Responsibilities/Dependents at home?: No    Home Care services in place prior to admission?: No     Equipment Currently Used at Home: none     Currently receiving outpatient dialysis?: No     Financial Information     Need for financial assistance?: No     Social Determinants of Health  Financial Resource Strain: Low Risk  (02/21/2020)    Overall Financial Resource Strain (CARDIA)     Difficulty of Paying Living Expenses: Not hard at all   Internet Connectivity: Not on file   Food Insecurity: No Food Insecurity (02/21/2020)    Hunger Vital Sign     Worried About Running Out of Food in the Last Year: Never true     Ran Out of Food in the Last Year: Never true   Tobacco Use: High Risk (04/30/2022)    Patient History     Smoking Tobacco Use: Every Day     Smokeless Tobacco Use: Never     Passive Exposure: Not on file   Housing/Utilities: Low Risk  (02/21/2020)    Housing/Utilities     Within the past 12 months, have you ever stayed: outside, in a car, in a tent, in an overnight shelter, or temporarily in someone else's home (i.e. couch-surfing)?: No     Are you worried about losing your housing?: No     Within the past 12 months, have you been unable to get utilities (heat, electricity) when it was really needed?: No   Alcohol Use: Not on file   Transportation Needs: No Transportation Needs (02/21/2020)    PRAPARE - Therapist, art (Medical): No     Lack of Transportation (Non-Medical): No   Substance Use: Not on file   Health Literacy: Not on file   Physical Activity: Not on file   Interpersonal Safety: Not on file   Stress: Not on file   Intimate Partner Violence: Not on file   Depression: Not on file   Social Connections: Not on file     Complex Discharge Information    Is patient identified as a difficult/complex discharge?: No    Discharge Needs Assessment  Concerns to be Addressed: discharge planning (CM will follow for discharge needs)    Clinical Risk Factors: > 65, Principal Diagnosis: Cancer, Stroke, COPD, Heart Failure, AMI, Pneumonia, Joint Replacment    Barriers to taking medications: No    Prior overnight hospital stay or ED visit in last 90 days: No     Anticipated Changes Related to Illness:  (Will likely need to recover before resuming regular activities.)    Equipment Needed After Discharge: other (see comments) (CM will follow for discharge needs.)    Discharge Facility/Level of Care Needs: other (see comments) (CM will follow for discharge needs.)    Readmission  Risk of Unplanned Readmission Score: UNPLANNED READMISSION SCORE: 17.79%  Predictive Model Details 18% (Medium)  Factor Value    Calculated 05/01/2022 12:05 16% Number of active inpatient medication orders 22    [place] Risk of Unplanned Readmission Model 11% Diagnosis of cancer present     10% ECG/EKG order present in last 6 months     10% Charlson Comorbidity Index 8     10% Latest calcium low (8.0 mg/dL)     7% Imaging order present in last 6 months     6% Latest hemoglobin low (11.0 g/dL)     6% Phosphorous result present     6% Age 71     6% Number of ED visits in last six months 1     5% Active anticoagulant inpatient medication order present     5% Active corticosteroid inpatient medication order present     2% Future appointment scheduled     2% Current length of stay 1.689 days  1% Active ulcer inpatient medication order present      Readmitted Within the Last 30 Days? (No if blank)   Patient at risk for readmission?: Yes    Discharge Plan  Screen findings are: Discharge planning needs identified or anticipated (Comment). (CM will follow for discharge needs.)    Expected Discharge Date: 05/03/2022    Expected Transfer from Critical Care:      Initial Assessment complete?: Yes    Noe Gens, MSN, RN Case Manager   May 01, 2022 3:36 PM

## 2022-05-01 NOTE — Unmapped (Cosign Needed)
Oncology (MEDO) Progress Note    Assessment & Plan:   Karen Hays is a 71 y.o. 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     Principal Problem:    Nausea & vomiting  Active Problems:    Breast cancer (CMS-HCC)    Cancer related pain    Failure to thrive in adult    Abdominal pain  Resolved Problems:    * No resolved hospital problems. *      Active Problems  Stage IV ER+, PR-, HER2- breast cancer - Cancer related pain   Followed by Dr. Laney Pastor. Metastatic to bone and and recently diagnosed liver mets in 9/20230. Currently on fulvestrant-alpelisib 300 mg qam since 11/2021. Also on denosumab (11/02/2018-). Presented to the ED 10/23 with lower back pain radiating to BLE. MRI spine did not reveal spinal cord compression or fractures; known osseous mets with lumbar neuroforaminal narrowing noted. She was planned to switch to different line of therapy given new liver mets pending outpatient follow up. At this time, she interested in hearing about options for additional chemotherapy prior to making a decision regarding continuing treatment versus focusing on comfort    - Pain control  - Tylenol 325mg  Q6H PRN  - Continue home oxy 10mg  Q4H PRN  - IV morphine 1-2mg  Q3H PRN for breakthrouh pain  -dexamethasone 4 mg daily  -senna for bowel regimen  - Continue letrozole 2.5mg  daily  - Hold home alpelisib  - Hold home metformin 500mg  BID (prophylaxis against hyperglycemia 2/2 alpelisib)  - Dr. Laney Pastor will come and discuss further treatment option once repeat scans are done. CT c/a/p ordered.  -Rad onc consulted, appreciate recommendations. Will decide on which radiation schedule based on decision of GOC.      Nausea and vomiting - Lower abdominal pain, Elevated LFTs  Presenting with lower abdominal pain over the past few days with associated NBNB emesis. N/V likely secondary to alpelisib given time course. LFTs are trending down, these are likely elevated in the setting of her known liver met. Re-imaging as above.  - Zofran prn for nausea     Failure to thrive - Hyponatremia  Over 30 pound weight loss in the last 6 months. Recently missed several oncology appointments due to worsening fatigue. Hyponatremia noted on admission, likely secondary to n/v and poor PO.  - S/p LR 1L in ED  - Daily BMP  - PT/OT     Chronic Problems  Bipolar Disorder: Home lamotrigine 100mg , alprazolam 0.5mg  TID PRN  HLD: Home simvastatin  40mg   HTN: Home metoprolol  25mg    GERD: Home pantoprazole 40mg   History of tobacco: Decreased to 1-3 cig/day. Declines nicotine replacement.         Issues Impacting Complexity of Management:      Daily Checklist:  Diet: Regular Diet  DVT PPx: Lovenox 30mg  q24h  Electrolytes: Replete Potassium to >/= 3.6 and Magnesium to >/= 1.8  Code Status: Full Code  Dispo:  Home depending on GOC conversations    Team Contact Information:   Primary Team: Oncology (MEDO)  Primary Resident: Leighton Ruff, MD, MD  Resident's Pager: [phone] (Oncology Intern - Alvester Morin)    Interval History:   No acute events overnight. Patient states pain is 8/10, mostly in her back and hips. She  reports her abdominal pain is better. Her nausea and vomiting has improved.     ROS: Denies headache, dyspnea, chest pain, numbness or weakness.     Objective:   Temp:  [36.1 ??C (97 ??F)-38.2 ??C (100.8 ??F)] 36.1 ??C (97 ??F)  Heart Rate:  [71-87] 71  SpO2 Pulse:  [83] 83  Resp:  [16-18] 16  BP: (94-109)/(56-64) 94/56  SpO2:  [92 %-96 %] 93 %    Gen: NAD, converses, chronically ill appearing, frail  HENT: atraumatic, normocephalic, sclera anicteric.  Heart: RRR, no m/r/g  Lungs: CTAB, no crackles or wheezes  Abdomen: soft, NTND  Extremities: No edema

## 2022-05-01 NOTE — Unmapped (Signed)
Radiation Oncology Initial Visit Note    Encounter Date: 04/29/2022  Patient Name: Karen Hays  Date of Birth: 08-May-1951  ZOX:[id number]  Patient Age: 71 y.o.    Referring Physician: Dr. Salem Caster    Primary Care Provider:   Marlowe Kays, MD    Diagnoses:  1. Nausea and vomiting, unspecified vomiting type    2. Carcinoma of breast metastatic to bone, unspecified laterality (CMS-HCC)      Cancer Staging   Breast cancer (CMS-HCC)  Staging form: Breast, AJCC 8th Edition  - Clinical: Stage IIA (cT2, cN0, cM0, G2, ER+, PR-, HER2-) - Unsigned  - Pathologic stage from 09/28/2018: Stage IV (pT2, pN0(sn), cM1, G3, ER+, PR-, HER2-) - Signed by Jeanie Cooks, MD on 02/28/2021    Assessment and Plan:  Karen Hays is a [phone] with widely metastatic breast cancer, initially with R DCIS s/p partial mastectomy and RT, likely endocrine therapy in 2003, then with ipsilateral R breast recurrence with IDC, G2, ER+/PR-/HER2-, Oncotype 35, PET consistent with pulmonary and osseous metastatic disease, s/p R breast mastectomy and SLNB 09/2018 (pT2pN0(sn), G3, ER+/PR-/HER2-), 10/2018 left femur biopsy with metastatic carcinoma, chemotherapy consisting of letrozole + palbo 10/2018-10/2021 then transitioned to fulvestrant and alpelisib in 11/2021, at present admitted for severe lumbar spine pain in the setting of imaging consistent with diffuse metastatic involvement, no evidence of pathologic fracture, pending plan for next line systemic therapy is capecitabine. Also has history of RUL lung adenocarcinoma s/p lobectomy, pT1cpN0 in 02/2020.    Radiation oncology consulted for evaluation for the role of palliative radiotherapy given her lumbar pain in the setting of spinal metastatic disease.    Metastatic breast cancer: The patient has disease progression through most recent line of systemic therapy of fulvestrant and alpelisib.  She follows with Dr. Laney Pastor outpatient; plan is for capecitabine as next line systemic therapy. Unfortunately the patient has had a notable decline in functional status with some failure to thrive over the last several months. This is illustrated both through her husband's description of her recent function and loss of appetite and through her notable 30 or so pound weight loss over the last several months.  Agree with palliative care referral and multidisciplinary discussion of goals of care.    We discussed her case with neuroradiology in regard to most recent total spine MRI and confirmed that the diffuse heterogeneous bone marrow involvement through the spine is the most likely explanation of her low back pain. On the phone with me they specifically note disease in L2 and L3 looking more significant than much of the rest of the spine (also noted T1 however the patient is not complaining of pain at this site). With the patient and her husband at bedside I reviewed the utility of palliative radiation in the management of symptomatic bony metastasis. We discussed the logistics including CT simulation in our department sometime in the next several days. We discussed the potential side effects and risks, although I do expect this treatment to be relatively well-tolerated given the low doses that can be effective for the treatment of pain in a palliative setting. We favor a 5 fraction treatment rather than a single fraction treatment if she would need pain control for longer than the next several months.  She does not live nearby the [facility], therefore we would prefer for her to get radiation closer to home if she will need multiple treatments after discharge.  I discussed both with the  patient and with medical team that radiation can help improve pain in the first few days after treatment, however full pain response more likely to occur over a course of 2 to 6 weeks after treatment.    Summary  - Recommend palliative radiation to the lumbar spine  - Anticipate discharge on 05/01/2022  - Offered RT at main campus vs [facility], patient requests [facility]  - Plan for simulation 05/01/2022; start RT at [facility] 05/02/2022 then will start next line of systemic therapy shortly therafeter    History of Present Illness:    We were consulted by the inpatient medical oncology team for consideration of palliative radiation therapy for her low back pain from metastatic breast cancer.    The patient has a long oncologic history that is well summarized below briefly, she was initially diagnosed with right-sided breast cancer in 2003 and was treated with lumpectomy and adjuvant radiation therapy.  She then was diagnosed with either new primary or recurrent right-sided breast cancer in December 2019 and underwent mastectomy.  Unfortunately soon after mastectomy she was discovered to have metastatic disease.  She was initially treated with letrozole however with progression switched to fulvestrant and alpelisib in May 2023.  Over the last several months she reports significantly decreased appetite and her husband reports decreased activity at home.  She does not have any functional difficulty with eating just reports that food sounds bad and usually smells unpleasant to her.  She reports developing severe low back pain approximately 5 days ago.  The pain is in the mid lumbar spine and radiates into her hips and medial thigh.  She denies pain elsewhere in her body including upper spine, abdomen, chest.  She denies lower extremity sensory loss or decreased motor function.  She reports global weakness which is limited her mobility.    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     03/2002 - 04/2002 Radiation    External beam radiation to the right breast     06/11/2018 Initial Diagnosis    Breast cancer (CMS-HCC): Mammogram: Solid heterogeneous hypervascular mass in the right breast,3.3 x 2.9 x 2.6 cm  cm.  Left breast unremarkable.     06/26/2018 Biopsy     right breast biopsy: Intermediate grade infiltrating ductal carcinoma, Nottingham 2 (histologic score 3, nuclear score 2, mitotic count 1); ER 95%, PR negative, HER-2 negative, Ki-67 40%  Oncotype Dx 35 (> 15% chemotherapy benefit)     08/26/2018 Biopsy    Left breast Bx: 11:00, 2 cm from nipple, core needle biopsy  -Benign breast tissue with stromal fibrosis        09/08/2018 -  Cancer Staged    CT Chest:  1. Right breast mass consistent with patient's known breast cancer.  2. Right apical masslike pulmonary parenchymal opacities, concerning for adenocarcinoma.  Lung RADS: 4B  3. Bilateral upper lobe predominant groundglass nodular pulmonary parenchymal opacities, indeterminate. Favor infectious/inflammatory (given upper lobe predominance) but can't rule out metastases completely.attention on follow-up.     09/16/2018 -  Cancer Staged    PET CT:  FDG avid spiculated right apical pulmonary mass, which may represent primary lung malignancy versus lung metastasis in the setting of breast cancer. Multiple bilateral subcentimeter pulmonary nodules which demonstrate FDG avidity as above, most compatible with pulmonary metastases.  - Large right FDG avid breast mass, compatible with known diagnosis of  breast cancer.  - Increased radiotracer uptake in the left proximal femur, likely osseous metastatic disease.  - Right-sided laryngocele with internal debris.  - Hepatic steatosis.     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     --Small linear T1 hypointense, T2 hyperintense, enhancing focus in the lesser trochanter of the right femur which is nonspecific.     10/09/2018 Biopsy    Bone, femur, left proximal, core biopsy  - Metastatic carcinoma, consistent with patient's known breast carcinoma       10/19/2018 -  Chemotherapy    Letrozole 2.5 mg daily, and palbociclib 125 mg daily 21/28 days     12/28/2018 -  Cancer Staged    CT Chest:  2.6 cm right apical lesion unchanged compared to 09/08/2018; leading diagnostic consideration is for primary lung neoplasm.     Multiple additional pulmonary nodules nodules unchanged compared to 09/08/2018; indeterminate, but favor lung adenocarcinoma spectrum rather than breast cancer metastasis.       08/02/2019 -  Cancer Staged    CT CAP:  Right apical 2.6 cm lesion stable compared to 12/28/2018 and remains concerning for a primary lung neoplasm.  Multiple bilateral nodular opacities are stable compared to prior  NED in AP.         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)     11/09/2021 -  Chemotherapy    OP BREAST FULVESTRANT  fulvestrant 500 mg IM on days 1, 15 on Cycle 1, then day 1 of every cycle thereafter, every 28 days     Adenocarcinoma of right lung (CMS-HCC)   02/18/2020 Initial Diagnosis    Adenocarcinoma of right lung (CMS-HCC)     11/09/2021 -  Chemotherapy    OP BREAST FULVESTRANT  fulvestrant 500 mg IM on days 1, 15 on Cycle 1, then day 1 of every cycle thereafter, every 28 days Past Medical History:   Diagnosis Date   ??? Bipolar affective disorder (CMS-HCC)    ??? Breast cancer (CMS-HCC)     right   ??? Diabetes mellitus (CMS-HCC)    ??? GERD (gastroesophageal reflux disease)    ??? Hyperlipidemia    ??? Hypertension    ??? Kidney stones       Past Surgical History:   Procedure Laterality Date   ??? BREAST BIOPSY     ??? BREAST LUMPECTOMY     ???  PR BX/REMV,LYMPH NODE,DEEP AXILL Right 09/18/2018    Procedure: BX/EXC LYMPH NODE; OPEN, DEEP AXILRY NODE;  Surgeon: Aris Everts, MD;  Location: ASC OR [facility];  Service: Surgical Oncology Breast   ??? PR INTRAOPERATIVE SENTINEL LYMPH NODE ID W DYE INJECTION Right 09/18/2018    Procedure: INTRAOPERATIVE IDENTIFICATION SENTINEL LYMPH NODE(S) INCLUDE INJECTION NON-RADIOACTIVE DYE, WHEN PERFORMED;  Surgeon: Aris Everts, MD;  Location: ASC OR [facility];  Service: Surgical Oncology Breast   ??? PR MASTECTOMY, SIMPLE, COMPLETE Right 09/18/2018    Procedure: MASTECTOMY, SIMPLE, COMPLETE;  Surgeon: Aris Everts, MD;  Location: ASC OR [facility];  Service: Surgical Oncology Breast   ??? PR THORACOSCOPY SURG LOBECTOMY Right 02/18/2020    Procedure: THORACOSCOPY, SURGICAL; WITH LOBECTOMY (SINGLE LOBE);  Surgeon: Evert Kohl, MD;  Location: MAIN OR [facility];  Service: Thoracic   ??? RADIATION          I have reviewed old/outside medical records (please see above for summary).    Prior Radiation Therapy:  Yes.  PRIOR R breast RT.  Pacemaker:  No.    Pregnancy status:  Negative pregnancy test/infertile  Collagen Vascular Disease:  No.  Inflammatory Bowel Disease:  No.     OB History   Gravida Para Term Preterm AB Living   1 1 1  0 0 0   SAB IAB Ectopic Molar Multiple Live Births   0 0 0 0 0 0       Medications:  Current Facility-Administered Medications   Medication Dose Route Frequency Provider Last Rate Last Admin   ??? acetaminophen (TYLENOL) tablet 325 mg  325 mg Oral Q6H PRN Chagarlamudi, Hemadhanvi, MD   325 mg at 04/30/22 1221   ??? ALPRAZolam (XANAX) tablet 0.5 mg  0.5 mg Oral TID PRN Chagarlamudi, Hemadhanvi, MD       ??? dexAMETHasone (DECADRON) tablet 4 mg  4 mg Oral Daily Burgess Estelle, MD   4 mg at 04/30/22 1412   ??? emollient combination no.92 (LUBRIDERM) lotion 1 application.  1 application. Topical Continuous PRN Chagarlamudi, Eloy End, MD       ??? enoxaparin (LOVENOX) syringe 30 mg  30 mg Subcutaneous Q24H Chagarlamudi, Hemadhanvi, MD   30 mg at 04/30/22 0831   ??? lamoTRIgine (LaMICtal) tablet 100 mg  100 mg Oral Nightly Chagarlamudi, Hemadhanvi, MD       ??? letrozole [facility]) tablet 2.5 mg  2.5 mg Oral Daily Chagarlamudi, Hemadhanvi, MD   2.5 mg at 04/30/22 1221   ??? metoPROLOL succinate (Toprol-XL) 24 hr tablet 25 mg  25 mg Oral Daily Chagarlamudi, Hemadhanvi, MD       ??? morphine injection 1 mg  1 mg Intravenous Q3H PRN Chagarlamudi, Hemadhanvi, MD   1 mg at 04/30/22 0725    Or   ??? morphine injection 2 mg  2 mg Intravenous Q3H PRN Chagarlamudi, Hemadhanvi, MD   2 mg at 04/30/22 1229   ??? ondansetron (ZOFRAN-ODT) disintegrating tablet 4 mg  4 mg Oral Q8H PRN Chagarlamudi, Hemadhanvi, MD        Or   ??? ondansetron (ZOFRAN) injection 4 mg  4 mg Intravenous Q8H PRN Chagarlamudi, Hemadhanvi, MD   4 mg at 04/30/22 0830   ??? oxyCODONE (ROXICODONE) immediate release tablet 10 mg  10 mg Oral Q4H PRN Chagarlamudi, Hemadhanvi, MD   10 mg at 04/30/22 1256   ??? pantoprazole (PROTONIX) EC tablet 40 mg  40 mg Oral Daily Chagarlamudi, Hemadhanvi, MD   40 mg at 04/30/22 0831   ???  pravastatin (PRAVACHOL) tablet 80 mg  80 mg Oral Nightly Chagarlamudi, Hemadhanvi, MD       ??? senna (SENOKOT) tablet 2 tablet  2 tablet Oral Nightly PRN Ileene Musa, MD         Facility-Administered Medications Ordered in Other Encounters   Medication Dose Route Frequency Provider Last Rate Last Admin   ??? denosumab (XGEVA) injection 120 mg  120 mg Subcutaneous Once Jeanie Cooks, MD           Allergies:  Allergies   Allergen Reactions   ??? Chantix [Varenicline] Other (See Comments)     Aggression and irritability, nightmares  Reacted with Lamatrigine        Family History:  Reviewed    Social History:  Lives in [place]      Review of Systems:  A comprehensive review of 10 systems was negative except for pertinent positives noted in HPI.    Physical Exam:   BP 94/56  - Pulse 71  - Temp 36.1 ??C (97 ??F) (Oral)  - Resp 16  - Ht 179.1 cm (5' 10.5)  - Wt 50 kg (110 lb 3.2 oz)  - SpO2 93%  - BMI 15.59 kg/m??   General:  No acute distress, cachectic   Neuro:  Cognitively intact. CN II-XII focally intact, sensation intact to light touch in BLE  HEENT: Moist mucous membranes  Neck: Supple, midline  Cardio: Well perfused, warm  Lungs: Normal work of breathing  MSK: Pain to palpation in Lumbar spine but not elsewhere along spine  Psych: Normal mood and affect. Converses clearly and emotionally appropriate.    Imaging, Labs, and Pathology:  We have personally reviewed the imaging studies as detailed above.     Elita Quick, MD, MS  Radiation Oncology PGY-4    I saw and evaluated/examined the patient, participating in the key portions of the service. I agree with the assessment and plan as documented.    _____________________  Fuller Song, MD  Radiation Oncology  [facility]

## 2022-05-01 NOTE — Unmapped (Signed)
LOS #2 Metastatic stage 4 breast cancer with mets to liver, confirmed on biopsy. Full code. CT abdomen and chest completed overnight, showing worsening liver metastasis. Standing order to transfuse, Hb 11.2, platelet 174, soft BP at times but otherwise vitals stable. Alert and oriented x 4, on RA. Constipated but refused intervention. PRN Oxycodone and Morphine used for pain management. Denies nausea at this time. Tolerating moderate amounts of prescribed diet. Patient able to pivot to [facility], refused bed alarm. Education provided about fall risks. Lovenox for prophylaxis. Radiation oncology following, plan for palliative radiotherapy for 1-5 days. On Decadron daily. Radiation closer to home may need to be arranged after discharge as patient lives far distance away from [facility].     Problem: Adult Inpatient Plan of Care  Goal: Plan of Care Review  Outcome: Ongoing - Unchanged  Goal: Patient-Specific Goal (Individualized)  Outcome: Ongoing - Unchanged  Goal: Absence of Hospital-Acquired Illness or Injury  Outcome: Ongoing - Unchanged  Intervention: Identify and Manage Fall Risk  Recent Flowsheet Documentation  Taken 04/30/2022 2037 by Massie Bougie, RN  Safety Interventions:   fall reduction program maintained   lighting adjusted for tasks/safety   low bed   nonskid shoes/slippers when out of bed  Intervention: Prevent and Manage VTE (Venous Thromboembolism) Risk  Recent Flowsheet Documentation  Taken 04/30/2022 2247 by Massie Bougie, RN  VTE Prevention/Management:   ambulation promoted   anticoagulant therapy   dorsiflexion/plantar flexion performed   fluids promoted  Goal: Optimal Comfort and Wellbeing  Outcome: Ongoing - Unchanged  Goal: Readiness for Transition of Care  Outcome: Ongoing - Unchanged  Goal: Rounds/Family Conference  Outcome: Ongoing - Unchanged     Problem: Fall Injury Risk  Goal: Absence of Fall and Fall-Related Injury  Outcome: Ongoing - Unchanged  Intervention: Identify and Manage Contributors  Recent Flowsheet Documentation  Taken 04/30/2022 2247 by Massie Bougie, RN  Self-Care Promotion: independence encouraged  Intervention: Promote Injury-Free Environment  Recent Flowsheet Documentation  Taken 04/30/2022 2037 by Massie Bougie, RN  Safety Interventions:   fall reduction program maintained   lighting adjusted for tasks/safety   low bed   nonskid shoes/slippers when out of bed     Problem: Self-Care Deficit  Goal: Improved Ability to Complete Activities of Daily Living  Outcome: Ongoing - Unchanged  Intervention: Promote Activity and Functional Independence  Recent Flowsheet Documentation  Taken 04/30/2022 2247 by Massie Bougie, RN  Self-Care Promotion: independence encouraged     Problem: Impaired Wound Healing  Goal: Optimal Wound Healing  Outcome: Ongoing - Unchanged     Problem: Malnutrition  Goal: Improved Nutritional Intake  Outcome: Ongoing - Unchanged

## 2022-05-02 DIAGNOSIS — C7951 Secondary malignant neoplasm of bone: Principal | ICD-10-CM

## 2022-05-02 LAB — COMPREHENSIVE METABOLIC PANEL
ALBUMIN: 2.5 g/dL — ABNORMAL LOW (ref 3.4–5.0)
ALKALINE PHOSPHATASE: 410 U/L — ABNORMAL HIGH (ref 46–116)
ALT (SGPT): 62 U/L — ABNORMAL HIGH (ref 10–49)
ANION GAP: 5 mmol/L (ref 5–14)
AST (SGOT): 116 U/L — ABNORMAL HIGH (ref <=34)
BILIRUBIN TOTAL: 0.9 mg/dL (ref 0.3–1.2)
BLOOD UREA NITROGEN: 20 mg/dL (ref 9–23)
BUN / CREAT RATIO: 44
CALCIUM: 8.6 mg/dL — ABNORMAL LOW (ref 8.7–10.4)
CHLORIDE: 98 mmol/L (ref 98–107)
CO2: 29 mmol/L (ref 20.0–31.0)
CREATININE: 0.45 mg/dL — ABNORMAL LOW
EGFR CKD-EPI (2021) FEMALE: >90 mL/min/{1.73_m2} (ref >=60)
GLUCOSE RANDOM: 124 mg/dL (ref 70–179)
POTASSIUM: 3.5 mmol/L (ref 3.4–4.8)
PROTEIN TOTAL: 5.9 g/dL (ref 5.7–8.2)
SODIUM: 132 mmol/L — ABNORMAL LOW (ref 135–145)

## 2022-05-02 LAB — OSMOLALITY, RANDOM URINE: OSMOLALITY URINE: 235 mosm/kg

## 2022-05-02 LAB — CBC W/ AUTO DIFF
BASOPHILS ABSOLUTE COUNT: 0 10*9/L (ref 0.0–0.1)
BASOPHILS RELATIVE PERCENT: 0 %
EOSINOPHILS ABSOLUTE COUNT: 0 10*9/L (ref 0.0–0.5)
EOSINOPHILS RELATIVE PERCENT: 0.1 %
HEMATOCRIT: 30.2 % — ABNORMAL LOW (ref 34.0–44.0)
HEMOGLOBIN: 10.5 g/dL — ABNORMAL LOW (ref 11.3–14.9)
LYMPHOCYTES ABSOLUTE COUNT: 0.5 10*9/L — ABNORMAL LOW (ref 1.1–3.6)
LYMPHOCYTES RELATIVE PERCENT: 8.4 %
MEAN CORPUSCULAR HEMOGLOBIN CONC: 34.8 g/dL (ref 32.0–36.0)
MEAN CORPUSCULAR HEMOGLOBIN: 32.5 pg — ABNORMAL HIGH (ref 25.9–32.4)
MEAN CORPUSCULAR VOLUME: 93.2 fL (ref 77.6–95.7)
MEAN PLATELET VOLUME: 8.2 fL (ref 6.8–10.7)
MONOCYTES ABSOLUTE COUNT: 0.5 10*9/L (ref 0.3–0.8)
MONOCYTES RELATIVE PERCENT: 9.6 %
NEUTROPHILS ABSOLUTE COUNT: 4.5 10*9/L (ref 1.8–7.8)
NEUTROPHILS RELATIVE PERCENT: 81.9 %
PLATELET COUNT: 208 10*9/L (ref 150–450)
RED BLOOD CELL COUNT: 3.24 10*12/L — ABNORMAL LOW (ref 3.95–5.13)
RED CELL DISTRIBUTION WIDTH: 14.2 % (ref 12.2–15.2)
WBC ADJUSTED: 5.5 10*9/L (ref 3.6–11.2)

## 2022-05-02 LAB — SODIUM
SODIUM: 132 mmol/L — ABNORMAL LOW (ref 135–145)
SODIUM: 130 mmol/L — ABNORMAL LOW (ref 135–145)

## 2022-05-02 LAB — SODIUM, URINE, RANDOM: SODIUM URINE: <10 mmol/L

## 2022-05-02 LAB — OSMOLALITY, SERUM: OSMOLALITY MEASURED: 278 mosm/kg (ref 275–295)

## 2022-05-02 LAB — SLIDE REVIEW

## 2022-05-02 LAB — PHOSPHORUS: PHOSPHORUS: 1.9 mg/dL — ABNORMAL LOW (ref 2.4–5.1)

## 2022-05-02 LAB — CREATININE, URINE: CREATININE, URINE: 22.8 mg/dL

## 2022-05-02 LAB — MAGNESIUM: MAGNESIUM: 2.1 mg/dL (ref 1.6–2.6)

## 2022-05-02 MED ORDER — SENNOSIDES 8.6 MG TABLET
ORAL_TABLET | Freq: Every evening | ORAL | 0 refills | 30 days | Status: CP
Start: 2022-05-02 — End: 2022-06-01
  Filled 2022-05-02: qty 60, 30d supply, fill #0

## 2022-05-02 MED ORDER — OXYCODONE 10 MG TABLET
ORAL_TABLET | ORAL | 0 refills | 5 days | Status: CP | PRN
Start: 2022-05-02 — End: 2022-05-07
  Filled 2022-05-02: qty 25, 5d supply, fill #0

## 2022-05-02 MED ORDER — DEXAMETHASONE 4 MG TABLET
ORAL_TABLET | Freq: Every day | ORAL | 0 refills | 8 days | Status: CP
Start: 2022-05-02 — End: ?
  Filled 2022-05-02: qty 8, 8d supply, fill #0

## 2022-05-02 MED ADMIN — pantoprazole (PROTONIX) EC tablet 40 mg: 40 mg | ORAL | @ 13:00:00 | Stop: 2022-05-02

## 2022-05-02 MED ADMIN — dexAMETHasone (DECADRON) tablet 4 mg: 4 mg | ORAL | @ 13:00:00 | Stop: 2022-05-02

## 2022-05-02 MED ADMIN — oxyCODONE (ROXICODONE) immediate release tablet 10 mg: 10 mg | ORAL | Stop: 2022-05-13

## 2022-05-02 MED ADMIN — enoxaparin (LOVENOX) syringe 30 mg: 30 mg | SUBCUTANEOUS | @ 13:00:00 | Stop: 2022-05-02

## 2022-05-02 MED ADMIN — lamoTRIgine (LaMICtal) tablet 100 mg: 100 mg | ORAL

## 2022-05-02 MED ADMIN — pravastatin (PRAVACHOL) tablet 80 mg: 80 mg | ORAL

## 2022-05-02 MED ADMIN — oxyCODONE (ROXICODONE) immediate release tablet 10 mg: 10 mg | ORAL | @ 13:00:00 | Stop: 2022-05-02

## 2022-05-02 NOTE — Unmapped (Signed)
Inpatient Tobacco Cessation Counseling Note    This medical encounter was conducted virtually using [email or web address]  TeleHealth protocols.    I have identified myself to the patient and conveyed my credentials to Ms. Senner  I have explained the capabilities and limitations of telemedicine and the patient/proxy and myself both agree that it is appropriate for their current circumstances/symptoms.     Contact Information  Person Contacted: Essiemae Mater Phone number: [phone]      Phone Outcome: spoke with pt  Is there someone else in the room? Yes. What is your relationship? husband. Do you want this person here for the visit? yes.    Patient's location at the time of the telephone visit: Hospitalized at [facility]   Provider's location at the time of the telephone visit: At home, in [place]      Purpose of contact:     Pt participated in a telephone visit for tobacco cessation counseling.  Patient was admitted to hospital for Carcinoma of breast metastatic to bone, unspecified laterality (CMS-HCC) [C50.919, C79.51]  Nausea and vomiting, unspecified vomiting type [R11.2]. Patient consented to telephone visit given due to social isolation measures in place due to the COVID-19 pandemic.     Tobacco Use History and Assessment  Time Since Last Tobacco Use: 1 to 7 days ago  Tobacco Withdrawal (Past 24 Hours): None noted  Type of Tobacco Products Used: Cigarettes  Quantity Used: 5  Quantity Per: day  Other Household Members Use Tobacco: No  Age Began Use (Years Old): 15  Longest Time Without Tobacco: Weeks  # of Hours, Days, Weeks, Months or Years: 1  Most Recent Attempt: 1-5 years  Medications Used in Past Attempts: Nicotine Gum, Nicotine Lozenge, Nicotine Patch, Varenicline  Side Effects: All ineffective; didn't like taste of gum/lozenge  Previously seen by [facility]    Behavioral Assessment  Why Uses: 1. habit 2. stress-relief  Reasons to Become Tobacco Free: health  Barriers/Challenges: 1. long-standing habit 2. stress  Strategies: pt can use 1. NRT to manage cravings 2. hand-to-mouth replacements    NOTE: Pt denies having cravings to smoke and states she smokes approx 4-5cpd. Pt states she has been reducing her smoking since September and is down from closer to 20cpd. Pt lives with her husband, who quit smoking in 2006. SW and pt discussed health benefits of becoming tobacco-free in context of pt's cancer diagnosis and pt states she is highly motivated to stay quit from smoking after she is discharged; pt is being discharged today to get chemo in [place]. Pt states she tried nicotine patch/gum/lozenge/Chantix in the past and none were effective. Pt reviewed peppermints have been helpful in managing her cravings to smoke; SW and pt discussed physiological and behavioral aspects of nicotine dependence and ways to manage cravings. SW encouraged pt to stay quit from smoking after discharge and to keep peppermints handy when the cravings arise. SW provided pt with contact information, physical improvements related to tobacco cessation, and available resources (including outpatient Tobacco Treatment Program at [facility] and [facility]).    Treatment Plan  Please see below for medication recommendations in bold.   Cessation Meds Currently Using: None  Medications Recommended During Hospitalization: Patient being discharged  Outpatient/Discharge Medications Recommended: Patient declines  Patient's Plan Post Discharge/Visit: Stay quit    As part of this Telephone Visit, no in-person exam was conducted.     I personally  spent 13 minutes counseling the patient via telephone about tobacco cessation.  I spent an additional 8 minutes on pre- and post-visit activities.      The patient was physically located in [place] or a state in which I am permitted to provide care. The patient and/or parent/guardian understood that s/he may incur co-pays and cost sharing, and agreed to the telemedicine visit. The visit was reasonable and appropriate under the circumstances given the patient's presentation at the time.     The patient and/or parent/guardian has been advised of the potential risks and limitations of this mode of treatment (including, but not limited to, the absence of in-person examination) and has agreed to be treated using telemedicine. The patient's/patient's family's questions regarding telemedicine have been answered.      If the visit was completed in an ambulatory setting, the patient and/or parent/guardian has also been advised to contact their provider???s office for worsening conditions, and seek emergency medical treatment and/or call 911 if the patient deems either necessary.     Visit Format/Coding: Telephone     Coding: 02725 (11-20 minutes)  Service rendered over the phone most consistent with: Tobacco cessation counseling, greater than 10 minutes ([id number])      Venancio Poisson, LCSW, NCTTP  Clinical Social Worker / Tobacco Treatment Specialist  [facility]  Phone: [phone]

## 2022-05-02 NOTE — Unmapped (Cosign Needed)
Oncology (MEDO) Progress Note    Assessment & Plan:   Karen Hays is a 71 y.o. 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     Principal Problem:    Nausea & vomiting  Active Problems:    Breast cancer (CMS-HCC)    Cancer related pain    Failure to thrive in adult    Abdominal pain  Resolved Problems:    * No resolved hospital problems. *      Active Problems  Stage IV ER+, PR-, HER2- breast cancer - Cancer related pain   Followed by Dr. Laney Pastor. Metastatic to bone and and recently diagnosed liver mets in 9/20230. Currently on fulvestrant-alpelisib 300 mg qam since 11/2021. Also on denosumab (11/02/2018-). Presented to the ED 10/23 with lower back pain radiating to BLE. MRI spine did not reveal spinal cord compression or fractures; known osseous mets with lumbar neuroforaminal narrowing noted. She was planned to switch to different line of therapy given new liver mets pending outpatient follow up. At this time, she interested in hearing about options for additional chemotherapy prior to making a decision regarding continuing treatment versus focusing on comfort    - Pain control  - Tylenol 325mg  Q6H PRN  - Continue home oxy 10mg  Q4H PRN  - IV morphine 1-2mg  Q3H PRN for breakthrouh pain  -dexamethasone 4 mg daily  -senna for bowel regimen  - Stopped letrozole 2.5mg  daily  - Hold home alpelisib  - Hold home metformin 500mg  BID (prophylaxis against hyperglycemia 2/2 alpelisib)  - Dr. Laney Pastor discussed with patient findings on imaging and patient will like to start therapy. She will start capecitabine once prior authorization is done.   -Rad onc consulted, appreciate recommendations.   -started palliative XRT today, plan for outpatient XRT tomorrow.      Nausea and vomiting - Lower abdominal pain, Elevated LFTs  Presenting with lower abdominal pain over the past few days with associated NBNB emesis. N/V likely secondary to alpelisib given time course. LFTs are trending down, these are likely elevated in the setting of her known liver met. Re-imaging as above.  - Zofran prn for nausea     Failure to thrive - Hyponatremia  Over 30 pound weight loss in the last 6 months. Recently missed several oncology appointments due to worsening fatigue. Hyponatremia noted on admission, likely secondary to n/v and poor PO.  - 1L NS bolus given with downtrending Na  -urine lytes and serum osm ordered  - Daily BMP  - PT/OT     Chronic Problems  Bipolar Disorder: Home lamotrigine 100mg , alprazolam 0.5mg  TID PRN  HLD: Home simvastatin  40mg   HTN: Home metoprolol  25mg    GERD: Home pantoprazole 40mg   History of tobacco: Decreased to 1-3 cig/day. Declines nicotine replacement.         Issues Impacting Complexity of Management:      Daily Checklist:  Diet: Regular Diet  DVT PPx: Lovenox 30mg  q24h  Electrolytes: Replete Potassium to >/= 3.6 and Magnesium to >/= 1.8  Code Status: Full Code  Dispo:  Home depending on GOC conversations    Team Contact Information:   Primary Team: Oncology (MEDO)  Primary Resident: Leighton Ruff, MD, MD  Resident's Pager: [phone] (Oncology Intern - Alvester Morin)    Interval History:  No acute events overnight. Patient states her pain is much more controlled today. She is moving around much better today and has more of an appetite.     ROS: Denies headache, dyspnea, chest pain, numbness or weakness.     Objective:   Temp:  [35.9 ??C (96.6 ??F)-36.8 ??C (98.2 ??F)] 36.1 ??C (97 ??F)  Heart Rate:  [68-75] 68  Resp:  [16-18] 18  BP: (91-112)/(54-61) 112/58  SpO2:  [93 %-100 %] 98 %    Gen: NAD, converses, chronically ill appearing, frail  HENT: atraumatic, normocephalic, sclera anicteric.  Heart: RRR, no m/r/g  Lungs: CTAB, no crackles or wheezes  Abdomen: soft, NTND  Extremities: No edema

## 2022-05-02 NOTE — Unmapped (Signed)
Alert,oriented.Oxycodone given for pain.Vitals stable.Fall precautions taken,safety ensured.No acute event noted during shift.  Problem: Adult Inpatient Plan of Care  Goal: Plan of Care Review  Outcome: Progressing  Goal: Patient-Specific Goal (Individualized)  Outcome: Progressing  Goal: Absence of Hospital-Acquired Illness or Injury  Outcome: Progressing  Intervention: Identify and Manage Fall Risk  Recent Flowsheet Documentation  Taken 05/02/2022 0200 by Michelle Nasuti, RN  Safety Interventions:   environmental modification   fall reduction program maintained   low bed   lighting adjusted for tasks/safety   nonskid shoes/slippers when out of bed  Taken 05/01/2022 2000 by Michelle Nasuti, RN  Safety Interventions:   commode/urinal/bedpan at bedside   environmental modification   fall reduction program maintained   low bed   lighting adjusted for tasks/safety   nonskid shoes/slippers when out of bed  Intervention: Prevent and Manage VTE (Venous Thromboembolism) Risk  Recent Flowsheet Documentation  Taken 05/01/2022 2000 by Michelle Nasuti, RN  Activity Management: activity adjusted per tolerance  VTE Prevention/Management: ambulation promoted  Goal: Optimal Comfort and Wellbeing  Outcome: Progressing  Goal: Readiness for Transition of Care  Outcome: Progressing  Goal: Rounds/Family Conference  Outcome: Progressing     Problem: Fall Injury Risk  Goal: Absence of Fall and Fall-Related Injury  Outcome: Progressing  Intervention: Promote Injury-Free Environment  Recent Flowsheet Documentation  Taken 05/02/2022 0200 by Michelle Nasuti, RN  Safety Interventions:   environmental modification   fall reduction program maintained   low bed   lighting adjusted for tasks/safety   nonskid shoes/slippers when out of bed  Taken 05/01/2022 2000 by Michelle Nasuti, RN  Safety Interventions:   commode/urinal/bedpan at bedside   environmental modification   fall reduction program maintained   low bed   lighting adjusted for tasks/safety   nonskid shoes/slippers when out of bed     Problem: Self-Care Deficit  Goal: Improved Ability to Complete Activities of Daily Living  Outcome: Progressing     Problem: Impaired Wound Healing  Goal: Optimal Wound Healing  Outcome: Progressing  Intervention: Promote Wound Healing  Recent Flowsheet Documentation  Taken 05/01/2022 2000 by Michelle Nasuti, RN  Activity Management: activity adjusted per tolerance     Problem: Malnutrition  Goal: Improved Nutritional Intake  Outcome: Progressing

## 2022-05-02 NOTE — Unmapped (Signed)
 Physician Discharge Summary [facility]  4 ONC [facility]  [address]  [place] [address]  Dept: [phone]  Loc: [phone]     Identifying Information:   Karen Hays  1951/06/02  [id number]    Primary Care Physician: Marlowe Kays, MD     Code Status: Full Code    Admit Date: 04/29/2022    Discharge Date: 05/02/2022     Discharge To: Home    Discharge Service: [facility] - Oncology Floor Team ([facility])     Discharge Attending Physician: Jennye Moccasin, MD    Discharge Diagnoses:   Principal Problem:    Nausea & vomiting (POA: Unknown)  Active Problems:    Breast cancer (CMS-HCC) (POA: Yes)    Cancer related pain (POA: Unknown)    Failure to thrive in adult (POA: Unknown)    Abdominal pain (POA: Unknown)    Tobacco use disorder (POA: Yes)  Resolved Problems:    * No resolved hospital problems. [facility] Course:     The patient's hospital stay has been complicated by the following clinically significant conditions requiring additional evaluation and treatment or having a significant effect of this patient's care: - Malnutrition POA requiring further investigation, treatment, or monitoring  - Dehydration POA requiring further investigation, treatment, or monitoring  - Hyponatremia POA requiring further investigation, treatment, or monitoring     Stage IV ER+, PR-, HER2- breast cancer - Cancer related pain   Followed by Dr. Laney Pastor. Metastatic to bone and and recently diagnosed liver mets in 9/20230. Previously on fulvestrant-alpelisib 300 mg qam since 11/2021. Also on denosumab (11/02/2018-). Presented to the ED 10/23 with lower back pain radiating to BLE. MRI spine did not reveal spinal cord compression or fractures; known osseous mets with lumbar neuroforaminal narrowing noted. Repeat scans done while inpatient demonstrating the following: CT showing worsening liver disease and a mild increase in her small volume abdominopelvic ascites. CT chest was largely stable (question of worsening skeletal mets).   - Seen by radiation oncology; patient strongly preferred RT at [facility] and was discharged to home with plan for outpatient RT in [facility]  - Continued home oxycodone,   - Dexamethasone started for pain related to bony metastasis, can stop at end of RT given likelihood of pain control with RT  - Radiation therapy as outpatient to start Monday 10/30  - Visited by primary oncologist Dr. Laney Pastor during hospitalization and plan for outpatient capecitabine initiation    Nausea and vomiting - Lower abdominal pain, Elevated LFTs  Presenting with lower abdominal pain over the past few days with associated NBNB emesis. N/V likely secondary to alpelisib given time course. LFTs are trending down, these are likely elevated in the setting of her known liver met. Re-imaging as above.  - Zofran prn for nausea     Failure to thrive - Hyponatremia  Over 30 pound weight loss in the last 6 months. Recently missed several oncology appointments due to worsening fatigue. Hyponatremia noted on admission, likely secondary to n/v and poor PO. Resolved after NS bolus.   - Encourage PO intake.   - Consider appetite stimulant as outpatient.     Chronic Problems  Bipolar Disorder: Home lamotrigine 100mg , alprazolam 0.5mg  TID PRN  HLD: Home simvastatin  40mg   HTN: Home metoprolol  25mg    GERD: Home pantoprazole 40mg   History of tobacco: Decreased to 1-3 cig/day. Declines nicotine replacement.    Nutrition Assessment:   Severe Protein-Calorie  Malnutrition in the context of chronic illness (04/30/22 1301)  Energy Intake: < or equal to 75% of estimated energy requirement for > or equal to 1 month  Interpretation of Wt. Loss: > 10% x 6 month  Malnutrition Score: 2    Outpatient Provider Follow Up Issues:   Management of pain and symptoms related to advanced malignancy.     Touchbase with Outpatient Provider:  Follow up scheduled with Dr. Marta Antu team as well as radiation therapy scheduled. Procedures:  None  ______________________________________________________________________  Discharge Medications:      Your Medication List        STOP taking these medications      furosemide 20 MG tablet  Commonly known as: LASIX     letrozole 2.5 mg tablet  Commonly known as: FEMARA     metFORMIN 500 MG tablet  Commonly known as: GLUCOPHAGE     PIQRAY 300 mg/day (150 mg x 2) Tab  Generic drug: alpelisib            START taking these medications      capecitabine 500 MG tablet  Commonly known as: XELODA  Take 3 tablets (1,500 mg total) by mouth two (2) times a day. For 14 days on and 7 days off.     dexAMETHasone 4 MG tablet  Commonly known as: DECADRON  Take 1 tablet (4 mg total) by mouth daily.     senna 8.6 mg tablet  Commonly known as: SENOKOT  Take 2 tablets by mouth nightly.            CHANGE how you take these medications      oxyCODONE 10 mg immediate release tablet  Commonly known as: ROXICODONE  Take 1 tablet (10 mg total) by mouth every four (4) hours as needed for up to 5 days.  What changed: See the new instructions.            CONTINUE taking these medications      ALPRAZolam 0.5 MG tablet  Commonly known as: XANAX  Take 1 tablet (0.5 mg total) by mouth.     cholecalciferol (vitamin D3-25 mcg (1,000 unit)) 25 mcg (1,000 unit) capsule  Take 1 capsule (25 mcg total) by mouth daily.     lamoTRIgine 100 MG tablet  Commonly known as: LaMICtal  Take 1 tablet (100 mg total) by mouth nightly.     metoPROLOL succinate 25 MG 24 hr tablet  Commonly known as: Toprol-XL  Take 1 tablet (25 mg total) by mouth daily.     pantoprazole 40 MG tablet  Commonly known as: PROTONIX  Take 1 tablet (40 mg total) by mouth daily.     prochlorperazine 10 MG tablet  Commonly known as: COMPAZINE  Take 1 tablet (10 mg total) by mouth every six (6) hours as needed for nausea.     simvastatin 40 MG tablet  Commonly known as: ZOCOR  Take 1 tablet (40 mg total) by mouth nightly.              Allergies:  Chantix [varenicline]  ______________________________________________________________________  Pending Test Results:  Pending Labs       Order Current Status    Sodium Collected (05/02/22 0827)    Sodium In process            Most Recent Labs:  All lab results last 24 hours -   Recent Results (from the past 24 hour(s))   Basic Metabolic Panel    Collection Time: 05/01/22  6:24  PM   Result Value Ref Range    Sodium 125 (L) 135 - 145 mmol/L    Potassium 3.7 3.4 - 4.8 mmol/L    Chloride 94 (L) 98 - 107 mmol/L    CO2 26.0 20.0 - 31.0 mmol/L    Anion Gap 5 5 - 14 mmol/L    BUN 20 9 - 23 mg/dL    Creatinine 2.84 (L) 0.55 - 1.02 mg/dL    BUN/Creatinine Ratio 44     eGFR CKD-EPI (2021) Female >90 >=60 mL/min/1.37m2    Glucose 168 70 - 179 mg/dL    Calcium 8.0 (L) 8.7 - 10.4 mg/dL   Sodium    Collection Time: 05/01/22  7:59 PM   Result Value Ref Range    Sodium 127 (L) 135 - 145 mmol/L   Comprehensive Metabolic Panel    Collection Time: 05/02/22  8:26 AM   Result Value Ref Range    Sodium 132 (L) 135 - 145 mmol/L    Potassium 3.5 3.4 - 4.8 mmol/L    Chloride 98 98 - 107 mmol/L    CO2 29.0 20.0 - 31.0 mmol/L    Anion Gap 5 5 - 14 mmol/L    BUN 20 9 - 23 mg/dL    Creatinine 1.32 (L) 0.55 - 1.02 mg/dL    BUN/Creatinine Ratio 44     eGFR CKD-EPI (2021) Female >90 >=60 mL/min/1.56m2    Glucose 124 70 - 179 mg/dL    Calcium 8.6 (L) 8.7 - 10.4 mg/dL    Albumin 2.5 (L) 3.4 - 5.0 g/dL    Total Protein 5.9 5.7 - 8.2 g/dL    Total Bilirubin 0.9 0.3 - 1.2 mg/dL    AST 440 (H) <=10 U/L    ALT 62 (H) 10 - 49 U/L    Alkaline Phosphatase 410 (H) 46 - 116 U/L   Magnesium Level    Collection Time: 05/02/22  8:26 AM   Result Value Ref Range    Magnesium 2.1 1.6 - 2.6 mg/dL   Phosphorus Level    Collection Time: 05/02/22  8:26 AM   Result Value Ref Range    Phosphorus 1.9 (L) 2.4 - 5.1 mg/dL   Sodium    Collection Time: 05/02/22  8:26 AM   Result Value Ref Range    Sodium 132 (L) 135 - 145 mmol/L   CBC w/ Differential    Collection Time: 05/02/22  8:26 AM   Result Value Ref Range    WBC 5.5 3.6 - 11.2 10*9/L    RBC 3.24 (L) 3.95 - 5.13 10*12/L    HGB 10.5 (L) 11.3 - 14.9 g/dL    HCT 27.2 (L) 53.6 - 44.0 %    MCV 93.2 77.6 - 95.7 fL    MCH 32.5 (H) 25.9 - 32.4 pg    MCHC 34.8 32.0 - 36.0 g/dL    RDW 64.4 03.4 - 74.2 %    MPV 8.2 6.8 - 10.7 fL    Platelet 208 150 - 450 10*9/L    Neutrophils % 81.9 %    Lymphocytes % 8.4 %    Monocytes % 9.6 %    Eosinophils % 0.1 %    Basophils % 0.0 %    Absolute Neutrophils 4.5 1.8 - 7.8 10*9/L    Absolute Lymphocytes 0.5 (L) 1.1 - 3.6 10*9/L    Absolute Monocytes 0.5 0.3 - 0.8 10*9/L    Absolute Eosinophils 0.0 0.0 - 0.5 10*9/L    Absolute Basophils 0.0  0.0 - 0.1 10*9/L   Morphology Review    Collection Time: 05/02/22  8:26 AM   Result Value Ref Range    Smear Review Comments See Comment (A) Undefined    Burr Cells Present (A) Not Present    Acanthocytes Present (A) Not Present    Dohle Bodies Present (A) Not Present    Hypersegmented Neutrophils Present (A) Not Present    Toxic Vacuolation Present (A) Not Present    Poikilocytosis Moderate (A) Not Present   Osmolality, Serum    Collection Time: 05/02/22  8:26 AM   Result Value Ref Range    Osmolality Meas 278 275 - 295 mOsm/kg   Sodium, Random Urine    Collection Time: 05/02/22 10:38 AM   Result Value Ref Range    Sodium, Ur <10 Undefined mmol/L   Osmolality, Random Urine    Collection Time: 05/02/22 10:38 AM   Result Value Ref Range    Osmolality, Ur 235 Undefined mOsm/kg   Creatinine, Urine    Collection Time: 05/02/22 10:38 AM   Result Value Ref Range    Creat U 22.8 Undefined mg/dL       Relevant Studies/Radiology:  No results found.  ______________________________________________________________________  Discharge Instructions:   Activity Instructions       Activity as tolerated                           Appointments which have been scheduled for you      May 06, 2022 10:15 AM  (Arrive by 10:00 AM)  TREATMENT with Peterson Lombard, MD  [facility] RADIATION ONCOLOGY [facility] REGION) [address]  1st Floor  [place] [address]  [phone]        May 07, 2022 10:15 AM  (Arrive by 10:00 AM)  TREATMENT with [facility] TREATMENT [facility]  [facility] RADIATION ONCOLOGY [facility] REGION) [address]  1[address] Floor  [place] [address]  [phone]        May 08, 2022  8:00 AM  (Arrive by 7:45 AM)  TREATMENT with [facility] TREATMENT [facility]  [facility] RADIATION ONCOLOGY [facility] REGION) [address]  1st Floor  [place] [address]  [phone]        May 08, 2022  8:00 AM  (Arrive by 7:45 AM)  PROGRESS CHECK with Peterson Lombard, MD  [facility] RADIATION ONCOLOGY [facility] REGION) [address]  1st Floor  [place] [address]  [phone]        May 09, 2022  8:00 AM  (Arrive by 7:45 AM)  TREATMENT with [facility] TREATMENT [facility]  [facility] RADIATION ONCOLOGY [facility] REGION) [address]  1st Floor  [place] [address]  [phone]        May 10, 2022  8:00 AM  (Arrive by 7:45 AM)  TREATMENT with [facility] TREATMENT [facility]  [facility] RADIATION ONCOLOGY [facility] REGION) [address]  1st Floor  [place] [address]  [phone]        May 15, 2022  9:30 AM  (Arrive by 9:00 AM)  NURSE LAB DRAW with ADULT ONC LAB  [facility] ADULT ONCOLOGY LAB DRAW STATION [facility] REGION) [address]  [place] [address]  [phone]        May 15, 2022 10:30 AM  (Arrive by 10:00 AM)  RETURN ACTIVE [place] with Jeanie Cooks,  MD  [place] ONCOLOGY MULTIDISCIPLINARY 2ND FLR CANCER HOSP [facility] REGION) [address]  [place] [address]  [phone]        Sep 13, 2022 10:00 AM  (Arrive by 9:45 AM)  CT CHEST WO CONTRAST with [facility] CT RM 1  IMG CT [facility]) [address]  [place] [address]  [phone] Let us know if pt:  Pregnant or nursing  Claustrophobic    (Title:[id number])         Sep 17, 2022 11:00 AM  (Arrive by 10:30 AM)  RETURN FOLLOW UP [facility] with Evert Kohl, MD  [facility] SURGERY ONCOLOGY [facility] REGION) [address]  [place] [address]  [phone]             ______________________________________________________________________  Discharge Day Services:  BP 110/65  - Pulse 69  - Temp 36.1 ??C (97 ??F) (Oral)  - Resp 18  - Ht 179.1 cm (5' 10.5)  - Wt 50 kg (110 lb 3.2 oz)  - SpO2 96%  - BMI 15.59 kg/m??     Pt seen on the day of discharge and determined appropriate for discharge.    Condition at Discharge: stable    Length of Discharge: I spent greater than 30 mins in the discharge of this patient.    I saw and evaluated the patient, participating in the key portions of the service on the day of discharge.  I reviewed the resident???s note and agree with the discharge plans and disposition. I personally spent >30 minutes in discharge planning services. Marc Morgans Kimyatta Lecy, MD

## 2022-05-02 NOTE — Unmapped (Signed)
Pt alert and oriented x4, husband at bedside. Discharge instruction reviewed with patient. Pt verbalized understanding of up coming appts.   Problem: Adult Inpatient Plan of Care  Goal: Plan of Care Review  Outcome: Discharged to Home  Goal: Patient-Specific Goal (Individualized)  Outcome: Discharged to Home  Goal: Absence of Hospital-Acquired Illness or Injury  Outcome: Discharged to Home  Intervention: Prevent and Manage VTE (Venous Thromboembolism) Risk  Recent Flowsheet Documentation  Taken 05/02/2022 0900 by Gunnar Fusi, RN  VTE Prevention/Management:   ambulation promoted   bleeding precautions maintained  Goal: Optimal Comfort and Wellbeing  Outcome: Discharged to Home  Goal: Readiness for Transition of Care  Outcome: Discharged to Home  Goal: Rounds/Family Conference  Outcome: Discharged to Home     Problem: Fall Injury Risk  Goal: Absence of Fall and Fall-Related Injury  Outcome: Discharged to Home     Problem: Self-Care Deficit  Goal: Improved Ability to Complete Activities of Daily Living  Outcome: Discharged to Home     Problem: Impaired Wound Healing  Goal: Optimal Wound Healing  Outcome: Discharged to Home     Problem: Malnutrition  Goal: Improved Nutritional Intake  Outcome: Discharged to Home

## 2022-05-04 MED ORDER — OXYCODONE 10 MG TABLET
ORAL_TABLET | ORAL | 0 refills | 10 days | Status: CP | PRN
Start: 2022-05-04 — End: ?

## 2022-05-04 NOTE — Unmapped (Signed)
I called patient to follow-up on recent hospitalization and remind her of her RT appointments on Monday.    She did not pick up on her listed phone numbers so called her husband's cell and reached them both.    Her pain is not all that well controlled - taking oxycodone q4h and running low on them. I recommended starting MS contin 15mg  BID and also will refill oxycodone 10mg  so she has enough supply over the weekend. Reviewed need for bowel regimen as well. Also reviewed need to take dexamethasone for pain control.    Confirmed her appointments for Monday and she/husband are aware. Reiterated need for RT to effectively control pain.

## 2022-05-06 NOTE — Unmapped (Signed)
Patient was contacted regarding blood draw to pair with recent tissue donation during liver biopsy, before starting radiation, and the patient declined.     Robert Bellow; [facility] [phone] Breast Cancer [facility]

## 2022-05-08 ENCOUNTER — Ambulatory Visit: Admit: 2022-05-08 | Payer: MEDICARE

## 2022-05-08 ENCOUNTER — Ambulatory Visit: Admit: 2022-05-08 | Payer: MEDICARE | Attending: Radiation Oncology | Primary: Radiation Oncology

## 2022-05-09 DIAGNOSIS — C7951 Secondary malignant neoplasm of bone: Principal | ICD-10-CM

## 2022-05-10 NOTE — Unmapped (Signed)
RN has tried multiple times to reach patient today regarding her missed radiation appointments for the week of October 30th. RN did speak with pt on October 30th and pt stated she was not feeling well enough to come for her radiation appointment. Pt did not come Tuesday thru Thursday either. RN left voicemail messages on both pt's home and cell phone stating that if we did not hear from her by end of business day on Friday, Nov. 3, 2023, her current radiation appointments would be cancelled. Patient advised when she felt good enough to resume radiation treatments for her to call the [facility] Nurse line at [phone].

## 2022-05-13 NOTE — Unmapped (Signed)
.  Hi,     Patient Karen Hays contacted the Communication Center requesting to speak with the care team of Karen Hays to discuss:    -Patients daughter Victorino Dike called in stating that she is requesting that someone give her a call back in reference to setting up hospice for her mother.     Please contact Victorino Dike back  at [phone] asap.        Thank you,   Noland Fordyce  [facility] Cancer Communication Center   [phone]

## 2022-05-13 NOTE — Unmapped (Signed)
Returned call to [place].States her mother has taken a downward turn and they are requesting a referral to hospice.States would like to use [facility] in [place].    Let her know I will relay the request.Asked that she call back with any further needs.

## 2022-05-14 DIAGNOSIS — C7951 Secondary malignant neoplasm of bone: Principal | ICD-10-CM

## 2022-05-14 NOTE — Unmapped (Signed)
Called spouse and Realynn was next to him so I was able to talk to her directly. She confirmed her desire for hospice, she said  there is no way she is getting more treatment and would like to die at home in peace. She declined palliative RT. We have started the hospice process.

## 2022-05-14 NOTE — Unmapped (Signed)
TC to [facility] to speak with Intake for process to refer Karen H[facility]. Spoke with Tiffany on the Hospice line [phone]. She said daughter had called them yesterday to ask them to watch for referral. Tiffany requested we send demographics, office visit notes and referral order to fax # [phone]. She expects to see patient today, noting patient is in pain. Notes and referral faxed; received confirmation sheet for fax. Team updated

## 2022-05-14 NOTE — Unmapped (Signed)
[facility] Specialty Medication Onboarding    Specialty Medication: Capecitabine 500mg  tablet  Prior Authorization: Not Required   Financial Assistance: No - copay card or gant not available   Final Copay/Day Supply: $79.68 / 14 days    Insurance Restrictions: None     Notes to Pharmacist: N/A    The triage team has completed the benefits investigation and has determined that the patient is able to fill this medication at [facility]. Please contact the patient to complete the onboarding or follow up with the prescribing physician as needed.

## 2022-05-14 NOTE — Unmapped (Signed)
TC to [facility] in [place] to get fax # for referral to Hospice.  That [employer] only provides DME. They recommended calling another [facility] are closed for the day. I will call again tomorrow, and the front desk team is not sure if they provide hospice. Team updated.

## 2022-05-14 NOTE — Unmapped (Signed)
Specialty Medication(s): capecitabine    Ms.Gauthier has been dis-enrolled from the [facility] specialty pharmacy services due to transitioning to hospice.    Additional information provided to the patient: no    Rollen Sox, [employer]  [facility] Specialty Pharmacist

## 2022-05-14 NOTE — Unmapped (Signed)
Called patient and daughter to discuss plans for hospice, no response. Left a message for them to call us back.

## 2022-05-15 ENCOUNTER — Ambulatory Visit: Admit: 2022-05-15 | Payer: MEDICARE

## 2022-06-26 ENCOUNTER — Ambulatory Visit: Admit: 2022-06-26 | Payer: MEDICARE

## 2022-08-08 DEATH — deceased

## 2022-09-17 ENCOUNTER — Ambulatory Visit: Admit: 2022-09-17
